# Patient Record
Sex: Female | Born: 1985 | Race: Black or African American | Hispanic: No | Marital: Single | Smoking: Former smoker
Health system: Southern US, Community
[De-identification: ages and names within clinical notes are randomized; demographics above are authoritative.]

## PROBLEM LIST (undated history)

## (undated) DIAGNOSIS — J302 Other seasonal allergic rhinitis: Secondary | ICD-10-CM

## (undated) HISTORY — PX: ECTOPIC PREGNANCY SURGERY: SHX613

---

## 2004-07-29 ENCOUNTER — Ambulatory Visit (HOSPITAL_COMMUNITY): Admission: RE | Admit: 2004-07-29 | Discharge: 2004-07-29 | Payer: Self-pay | Admitting: Obstetrics and Gynecology

## 2006-08-21 ENCOUNTER — Emergency Department (HOSPITAL_COMMUNITY): Admission: EM | Admit: 2006-08-21 | Discharge: 2006-08-21 | Payer: Self-pay | Admitting: Emergency Medicine

## 2007-09-22 ENCOUNTER — Inpatient Hospital Stay (HOSPITAL_COMMUNITY): Admission: AD | Admit: 2007-09-22 | Discharge: 2007-09-23 | Payer: Self-pay | Admitting: Gynecology

## 2010-12-18 ENCOUNTER — Encounter: Payer: Self-pay | Admitting: *Deleted

## 2010-12-18 ENCOUNTER — Emergency Department (HOSPITAL_BASED_OUTPATIENT_CLINIC_OR_DEPARTMENT_OTHER)
Admission: EM | Admit: 2010-12-18 | Discharge: 2010-12-18 | Disposition: A | Discharge status: Home / Self Care | Payer: Self-pay | Attending: Emergency Medicine | Admitting: Emergency Medicine

## 2010-12-18 ENCOUNTER — Emergency Department (INDEPENDENT_AMBULATORY_CARE_PROVIDER_SITE_OTHER): Payer: Self-pay

## 2010-12-18 DIAGNOSIS — B9689 Other specified bacterial agents as the cause of diseases classified elsewhere: Secondary | ICD-10-CM | POA: Insufficient documentation

## 2010-12-18 DIAGNOSIS — A499 Bacterial infection, unspecified: Secondary | ICD-10-CM | POA: Insufficient documentation

## 2010-12-18 DIAGNOSIS — R109 Unspecified abdominal pain: Secondary | ICD-10-CM

## 2010-12-18 DIAGNOSIS — N83209 Unspecified ovarian cyst, unspecified side: Secondary | ICD-10-CM

## 2010-12-18 DIAGNOSIS — N760 Acute vaginitis: Secondary | ICD-10-CM | POA: Insufficient documentation

## 2010-12-18 HISTORY — DX: Other seasonal allergic rhinitis: J30.2

## 2010-12-18 LAB — WET PREP, GENITAL
Trich, Wet Prep: NONE SEEN
Yeast Wet Prep HPF POC: NONE SEEN

## 2010-12-18 LAB — URINALYSIS, ROUTINE W REFLEX MICROSCOPIC
Hgb urine dipstick: NEGATIVE
Ketones, ur: 15 mg/dL — AB
Nitrite: NEGATIVE
Specific Gravity, Urine: 1.031 — ABNORMAL HIGH (ref 1.005–1.030)
pH: 6 (ref 5.0–8.0)

## 2010-12-18 MED ORDER — METRONIDAZOLE 500 MG PO TABS: 500.0000 mg | ORAL_TABLET | Freq: Two times a day (BID) | ORAL | Status: AC

## 2010-12-18 MED ORDER — HYDROCODONE-ACETAMINOPHEN 5-500 MG PO TABS: 1.0000 | ORAL_TABLET | Freq: Four times a day (QID) | ORAL | Status: AC | PRN

## 2010-12-18 NOTE — ED Notes (Signed)
Patient recently treated for chlamydia, partner was also treated. Patient now states that she is having pain in her pelvic region.

## 2010-12-18 NOTE — ED Provider Notes (Signed)
Evaluation and management procedures were performed by the mid-level provider (PA/NP/CNM) under my supervision/collaboration. I was present and available during the ED course. Zayna Toste Y.   Gavin Pound. Oletta Lamas, MD 12/18/10 2109

## 2010-12-18 NOTE — ED Provider Notes (Signed)
History     CSN: [id number] Arrival date & time: 12/18/2010  7:23 PM  Chief Complaint  Patient presents with  . Abdominal Pain    (Consider location/radiation/quality/duration/timing/severity/associated sxs/prior treatment) HPI Comments: Pt state that she was treated for chlamydia 2 weeks ago and she was asymptomatic when she found out:pt state that her partner was treated:pt states that she is having generalized lower abdominal pain:pt denies fever:pt states that is seemed worse after intercourse 2 days ago:pt denies dysuria  Patient is a 25 y.o. female presenting with abdominal pain. The history is provided by the patient.  Abdominal Pain The primary symptoms of the illness include abdominal pain. The primary symptoms of the illness do not include fever. The current episode started 2 days ago. The onset of the illness was sudden. The problem has not changed since onset. The patient states that she believes she is currently not pregnant. The patient has not had a change in bowel habit. Symptoms associated with the illness do not include constipation, urgency, frequency or back pain.    Past Medical History  Diagnosis Date  . Asthma   . Seasonal allergies     Past Surgical History  Procedure Date  . Ectopic pregnancy surgery     No family history on file.  History  Substance Use Topics  . Smoking status: Current Some Day Smoker  . Smokeless tobacco: Not on file  . Alcohol Use: No    OB History    Grav Para Term Preterm Abortions TAB SAB Ect Mult Living                  Review of Systems  Constitutional: Negative for fever.  Gastrointestinal: Positive for abdominal pain. Negative for constipation.  Genitourinary: Negative for urgency and frequency.  Musculoskeletal: Negative for back pain.  All other systems reviewed and are negative.    Allergies  Cinnamon; Doxycycline; and Penicillins  Home Medications   Current Outpatient Rx  Name Route Sig Dispense  Refill  . ALBUTEROL SULFATE HFA 108 (90 BASE) MCG/ACT IN AERS Inhalation Inhale 2 puffs into the lungs every 6 (six) hours as needed. Shortness of breath and wheezing     . BIOTIN 5000 PO Oral Take 1 tablet by mouth daily.      Marland Kitchen GARLIC 150 MG PO TABS Oral Take 1 tablet by mouth daily.      Marland Kitchen CLEAR EYES OP Both Eyes Place 2 drops into both eyes daily.      Marland Kitchen OVER THE COUNTER MEDICATION Oral Take 1 tablet by mouth daily. Hair Finity     . PROBIOTIC FORMULA PO Oral Take 1 tablet by mouth daily.      Marland Kitchen MEDROXYPROGESTERONE ACETATE 150 MG/ML IM SUSP Intramuscular Inject 150 mg into the muscle every 3 (three) months.        BP 102/75  Pulse 82  Temp 98.9 F (37.2 C)  Resp 18  SpO2 100%  Physical Exam  Nursing note and vitals reviewed. Constitutional: She is oriented to person, place, and time. She appears well-developed and well-nourished.  HENT:  Head: Normocephalic and atraumatic.  Eyes: Pupils are equal, round, and reactive to light.  Cardiovascular: Normal rate and regular rhythm.   Pulmonary/Chest: Effort normal and breath sounds normal.  Abdominal: Soft. There is tenderness.  Genitourinary:       Pt has white vaginal discharge:pt has no cmt  Musculoskeletal: Normal range of motion.  Neurological: She is alert and oriented to person, place, and time.  Skin: Skin is warm and dry.  Psychiatric: She has a normal mood and affect.    ED Course  Procedures (including critical care time)  Labs Reviewed  URINALYSIS, ROUTINE W REFLEX MICROSCOPIC - Abnormal; Notable for the following:    Color, Urine AMBER (*) BIOCHEMICALS MAY BE AFFECTED BY COLOR   Specific Gravity, Urine 1.031 (*)    Bilirubin Urine SMALL (*)    Ketones, ur 15 (*)    All other components within normal limits  WET PREP, GENITAL - Abnormal; Notable for the following:    Clue Cells, Wet Prep MANY (*)    WBC, Wet Prep HPF POC RARE (*)    All other components within normal limits  PREGNANCY, URINE  GC/CHLAMYDIA  PROBE AMP, GENITAL   US Transvaginal Non-ob  12/18/2010  *RADIOLOGY REPORT*  Clinical Data: 25 year old with pelvic pain.  Evaluate for tubo- ovarian abscess.  TRANSABDOMINAL AND TRANSVAGINAL ULTRASOUND OF PELVIS Technique:  Both transabdominal and transvaginal ultrasound examinations of the pelvis were performed. Transabdominal technique was performed for global imaging of the pelvis including uterus, ovaries, adnexal regions, and pelvic cul-de-sac.  Comparison: None.   It was necessary to proceed with endovaginal exam following the transabdominal exam to visualize the adnexa and uterus.  Findings:  Uterus: Normal in size and appearance.  The uterus measures 7.9 x 4.0 x 4.2 cm.  Endometrium: Measures 0.2 cm.  Right ovary:  The right adnexa measures 4.3 x 3.2 x 3.0 cm.  There is a hypoechoic cystic structure associated the right adnexa.  The structure measures 2.5 x 2.2 x 2.3 cm.  The cyst appears to be relatively simple.  There is no significant internal vascularity or internal septations.  Left ovary: Normal appearance of left ovary with small follicles. The left ovary measures 2.6 x 1.4 x 2.3 cm.  Other findings: No free fluid.  IMPRESSION: Right adnexal cyst.  The cyst measures up to 2.5 cm.  Original Report Authenticated By: Richarda Overlie, M.D.   US Pelvis Complete  12/18/2010  *RADIOLOGY REPORT*  Clinical Data: 25 year old with pelvic pain.  Evaluate for tubo- ovarian abscess.  TRANSABDOMINAL AND TRANSVAGINAL ULTRASOUND OF PELVIS Technique:  Both transabdominal and transvaginal ultrasound examinations of the pelvis were performed. Transabdominal technique was performed for global imaging of the pelvis including uterus, ovaries, adnexal regions, and pelvic cul-de-sac.  Comparison: None.   It was necessary to proceed with endovaginal exam following the transabdominal exam to visualize the adnexa and uterus.  Findings:  Uterus: Normal in size and appearance.  The uterus measures 7.9 x 4.0 x 4.2 cm.   Endometrium: Measures 0.2 cm.  Right ovary:  The right adnexa measures 4.3 x 3.2 x 3.0 cm.  There is a hypoechoic cystic structure associated the right adnexa.  The structure measures 2.5 x 2.2 x 2.3 cm.  The cyst appears to be relatively simple.  There is no significant internal vascularity or internal septations.  Left ovary: Normal appearance of left ovary with small follicles. The left ovary measures 2.6 x 1.4 x 2.3 cm.  Other findings: No free fluid.  IMPRESSION: Right adnexal cyst.  The cyst measures up to 2.5 cm.  Original Report Authenticated By: Richarda Overlie, M.D.     No diagnosis found.    MDM  Pt has cyst and JY:NWGN treat and pt can follow up with her gyn        Teressa Lower, NP 12/18/10 2100

## 2010-12-19 LAB — URINALYSIS, ROUTINE W REFLEX MICROSCOPIC
Bilirubin Urine: NEGATIVE
Hgb urine dipstick: NEGATIVE
Ketones, ur: NEGATIVE
Nitrite: NEGATIVE
Protein, ur: NEGATIVE
Specific Gravity, Urine: 1.025
Urobilinogen, UA: 1

## 2010-12-19 LAB — WET PREP, GENITAL: Trich, Wet Prep: NONE SEEN

## 2010-12-19 LAB — GC/CHLAMYDIA PROBE AMP, GENITAL
Chlamydia, DNA Probe: NEGATIVE
GC Probe Amp, Genital: NEGATIVE

## 2011-04-23 ENCOUNTER — Emergency Department (HOSPITAL_BASED_OUTPATIENT_CLINIC_OR_DEPARTMENT_OTHER)
Admission: EM | Admit: 2011-04-23 | Discharge: 2011-04-23 | Disposition: A | Discharge status: Home / Self Care | Payer: Medicaid Other | Attending: Emergency Medicine | Admitting: Emergency Medicine

## 2011-04-23 ENCOUNTER — Encounter (HOSPITAL_BASED_OUTPATIENT_CLINIC_OR_DEPARTMENT_OTHER): Payer: Self-pay | Admitting: *Deleted

## 2011-04-23 DIAGNOSIS — M26629 Arthralgia of temporomandibular joint, unspecified side: Secondary | ICD-10-CM

## 2011-04-23 DIAGNOSIS — J45909 Unspecified asthma, uncomplicated: Secondary | ICD-10-CM | POA: Insufficient documentation

## 2011-04-23 DIAGNOSIS — H9209 Otalgia, unspecified ear: Secondary | ICD-10-CM | POA: Insufficient documentation

## 2011-04-23 DIAGNOSIS — F17200 Nicotine dependence, unspecified, uncomplicated: Secondary | ICD-10-CM | POA: Insufficient documentation

## 2011-04-23 MED ORDER — NAPROXEN 500 MG PO TABS: 500.0000 mg | ORAL_TABLET | Freq: Two times a day (BID) | ORAL | Status: AC

## 2011-04-23 MED ORDER — CYCLOBENZAPRINE HCL 10 MG PO TABS: 10.0000 mg | ORAL_TABLET | Freq: Two times a day (BID) | ORAL | Status: AC | PRN

## 2011-04-23 NOTE — ED Provider Notes (Signed)
History     CSN: [id number]  Arrival date & time 04/23/11  6440   First MD Initiated Contact with Patient 04/23/11 1820      Chief Complaint  Patient presents with  . Otalgia    (Consider location/radiation/quality/duration/timing/severity/associated sxs/prior treatment) Patient is a 26 y.o. female presenting with ear pain. The history is provided by the patient. No language interpreter was used.  Otalgia This is a new problem. The current episode started more than 2 days ago. There is pain in the right ear. The problem occurs daily. The problem has been gradually worsening. There has been no fever. The pain is moderate.  Patient reports pain at right TMJ, onset several days ago--worse with chewing, opening mouth.  Past Medical History  Diagnosis Date  . Asthma   . Seasonal allergies     Past Surgical History  Procedure Date  . Ectopic pregnancy surgery     History reviewed. No pertinent family history.  History  Substance Use Topics  . Smoking status: Current Some Day Smoker  . Smokeless tobacco: Not on file  . Alcohol Use: No    OB History    Grav Para Term Preterm Abortions TAB SAB Ect Mult Living                  Review of Systems  HENT: Positive for ear pain.     Allergies  Cinnamon; Nutmeg oil (myristica oil); Doxycycline; and Penicillins  Home Medications   Current Outpatient Rx  Name Route Sig Dispense Refill  . ALBUTEROL SULFATE HFA 108 (90 BASE) MCG/ACT IN AERS Inhalation Inhale 2 puffs into the lungs every 6 (six) hours as needed. Shortness of breath and wheezing     . BIOTIN 5000 PO Oral Take 1 tablet by mouth daily.      Marland Kitchen GARLIC 150 MG PO TABS Oral Take 1 tablet by mouth daily.      Marland Kitchen MEDROXYPROGESTERONE ACETATE 150 MG/ML IM SUSP Intramuscular Inject 150 mg into the muscle every 3 (three) months.      Marland Kitchen CLEAR EYES OP Both Eyes Place 2 drops into both eyes daily.      Marland Kitchen OVER THE COUNTER MEDICATION Oral Take 1 tablet by mouth daily. Hair  Finity       Pulse 75  Temp 99.5 F (37.5 C)  Resp 16  Ht 5\' 4"  (1.626 m)  Wt 130 lb (58.968 kg)  BMI 22.31 kg/m2  SpO2 100%  Physical Exam  Constitutional: She appears well-developed and well-nourished.  HENT:  Head: Normocephalic.  Eyes: Pupils are equal, round, and reactive to light.  Neck: Normal range of motion. Neck supple.  Cardiovascular: Normal rate and regular rhythm.   Pulmonary/Chest: Effort normal and breath sounds normal.  Abdominal: Soft. Bowel sounds are normal.  Musculoskeletal: Normal range of motion.  Neurological: She is alert.  Skin: Skin is warm and dry.  Psychiatric: She has a normal mood and affect.    ED Course  Procedures (including critical care time)  Labs Reviewed - No data to display No results found.   No diagnosis found.   1.  TMJ disorder MDM          Jimmye Norman, NP 04/23/11 2027

## 2011-04-23 NOTE — ED Provider Notes (Signed)
Medical screening examination/treatment/procedure(s) were performed by non-physician practitioner and as supervising physician I was immediately available for consultation/collaboration.  Doug Sou, MD 04/23/11 2113

## 2011-04-23 NOTE — ED Notes (Signed)
Pt c/o right ear pain x 1 week 

## 2011-05-13 ENCOUNTER — Emergency Department (HOSPITAL_BASED_OUTPATIENT_CLINIC_OR_DEPARTMENT_OTHER)
Admission: EM | Admit: 2011-05-13 | Discharge: 2011-05-13 | Disposition: A | Discharge status: Home Health | Payer: Medicaid Other | Attending: Emergency Medicine | Admitting: Emergency Medicine

## 2011-05-13 ENCOUNTER — Encounter (HOSPITAL_BASED_OUTPATIENT_CLINIC_OR_DEPARTMENT_OTHER): Payer: Self-pay | Admitting: *Deleted

## 2011-05-13 DIAGNOSIS — J069 Acute upper respiratory infection, unspecified: Secondary | ICD-10-CM | POA: Insufficient documentation

## 2011-05-13 DIAGNOSIS — F17200 Nicotine dependence, unspecified, uncomplicated: Secondary | ICD-10-CM | POA: Insufficient documentation

## 2011-05-13 DIAGNOSIS — J45909 Unspecified asthma, uncomplicated: Secondary | ICD-10-CM | POA: Insufficient documentation

## 2011-05-13 NOTE — Discharge Instructions (Signed)
Antibiotic Nonuse  Your caregiver felt that the infection or problem was not one that would be helped with an antibiotic. Infections may be caused by viruses or bacteria. Only a caregiver can tell which one of these is the likely cause of an illness. A cold is the most common cause of infection in both adults and children. A cold is a virus. Antibiotic treatment will have no effect on a viral infection. Viruses can lead to many lost days of work caring for sick children and many missed days of school. Children may catch as many as 10 "colds" or "flus" per year during which they can be tearful, cranky, and uncomfortable. The goal of treating a virus is aimed at keeping the ill person comfortable. Antibiotics are medications used to help the body fight bacterial infections. There are relatively few types of bacteria that cause infections but there are hundreds of viruses. While both viruses and bacteria cause infection they are very different types of germs. A viral infection will typically go away by itself within 7 to 10 days. Bacterial infections may spread or get worse without antibiotic treatment. Examples of bacterial infections are:  Sore throats (like strep throat or tonsillitis).     Infection in the lung (pneumonia).     Ear and skin infections.  Examples of viral infections are:  Colds or flus.     Most coughs and bronchitis.     Sore throats not caused by Strep.     Runny noses.  It is often best not to take an antibiotic when a viral infection is the cause of the problem. Antibiotics can kill off the helpful bacteria that we have inside our body and allow harmful bacteria to start growing. Antibiotics can cause side effects such as allergies, nausea, and diarrhea without helping to improve the symptoms of the viral infection. Additionally, repeated uses of antibiotics can cause bacteria inside of our body to become resistant. That resistance can be passed onto harmful bacterial. The  next time you have an infection it may be harder to treat if antibiotics are used when they are not needed. Not treating with antibiotics allows our own immune system to develop and take care of infections more efficiently. Also, antibiotics will work better for us when they are prescribed for bacterial infections. Treatments for a child that is ill may include:  Give extra fluids throughout the day to stay hydrated.     Get plenty of rest.     Only give your child over-the-counter or prescription medicines for pain, discomfort, or fever as directed by your caregiver.     The use of a cool mist humidifier may help stuffy noses.     Cold medications if suggested by your caregiver.  Your caregiver may decide to start you on an antibiotic if:  The problem you were seen for today continues for a longer length of time than expected.     You develop a secondary bacterial infection.  SEEK MEDICAL CARE IF:  Fever lasts longer than 5 days.     Symptoms continue to get worse after 5 to 7 days or become severe.     Difficulty in breathing develops.     Signs of dehydration develop (poor drinking, rare urinating, dark colored urine).     Changes in behavior or worsening tiredness (listlessness or lethargy).  Document Released: 05/19/2001 Document Revised: 11/20/2010 Document Reviewed: 11/15/2008 Exit[employer] Patient Information 2012 [employer]. 

## 2011-05-13 NOTE — ED Notes (Signed)
Facial pain, sore throat, ear pain, runny nose and cough since yesterday.

## 2011-05-13 NOTE — ED Provider Notes (Addendum)
History     CSN: [id number]  Arrival date & time 05/13/11  1232   First MD Initiated Contact with Patient 05/13/11 1250      Chief Complaint  Patient presents with  . URI    (Consider location/radiation/quality/duration/timing/severity/associated sxs/prior treatment) Patient is a 26 y.o. female presenting with URI. The history is provided by the patient.  URI   the patient reports nasal congestion and sinus discomfort has been constant since last night.  She woke up with a runny nose that has been consistently running all day.  She also reports sore throat.  She denies fevers and chills.  She denies myalgias.  She denies cough or shortness of breath.  She denies chest pain.  She denies nausea vomiting and diarrhea.  She reports she has severe allergies and over-the-counter medications not been helping.  Nothing worsens her symptoms.  Nothing improves her symptoms.  Her symptoms are constant.  She denies headache  Past Medical History  Diagnosis Date  . Asthma   . Seasonal allergies     Past Surgical History  Procedure Date  . Ectopic pregnancy surgery     No family history on file.  History  Substance Use Topics  . Smoking status: Current Some Day Smoker  . Smokeless tobacco: Not on file  . Alcohol Use: No    OB History    Grav Para Term Preterm Abortions TAB SAB Ect Mult Living                  Review of Systems  All other systems reviewed and are negative.    Allergies  Cinnamon; Nutmeg oil (myristica oil); Doxycycline; and Penicillins  Home Medications   Current Outpatient Rx  Name Route Sig Dispense Refill  . ALBUTEROL SULFATE HFA 108 (90 BASE) MCG/ACT IN AERS Inhalation Inhale 2 puffs into the lungs every 6 (six) hours as needed. Shortness of breath and wheezing     . BIOTIN 5000 PO Oral Take 1 tablet by mouth daily.      Marland Kitchen GARLIC 150 MG PO TABS Oral Take 1 tablet by mouth daily.      Marland Kitchen MEDROXYPROGESTERONE ACETATE 150 MG/ML IM SUSP Intramuscular  Inject 150 mg into the muscle every 3 (three) months.      Marland Kitchen CLEAR EYES OP Both Eyes Place 2 drops into both eyes daily.      Marland Kitchen NAPROXEN 500 MG PO TABS Oral Take 1 tablet (500 mg total) by mouth 2 (two) times daily. 30 tablet 0  . OVER THE COUNTER MEDICATION Oral Take 1 tablet by mouth daily. Hair Finity       BP 105/72  Pulse 85  Temp(Src) 98.4 F (36.9 C) (Oral)  Resp 16  SpO2 100%  Physical Exam  Constitutional: She is oriented to person, place, and time. She appears well-developed and well-nourished.  HENT:  Head: Normocephalic.  Right Ear: Tympanic membrane, external ear and ear canal normal. Tympanic membrane is not injected.  Left Ear: Tympanic membrane, external ear and ear canal normal. Tympanic membrane is not injected.  Eyes: EOM are normal.  Neck: Normal range of motion.  Cardiovascular: Regular rhythm.   Pulmonary/Chest: Effort normal and breath sounds normal. She has no wheezes.  Musculoskeletal: Normal range of motion.  Neurological: She is alert and oriented to person, place, and time.  Psychiatric: She has a normal mood and affect.    ED Course  Procedures (including critical care time)  Labs Reviewed - No data to display  No results found.   1. Upper respiratory tract infection       MDM  Likely viral upper respiratory tract infections.  The patient is well-appearing.  She is nontoxic.  No hypoxia on exam.  Lung exam is clear.  Normal work of breathing.  No indication for chest x-ray.  Close followup with PCP         Lyanne Co, MD 05/13/11 1318  Lyanne Co, MD 05/13/11 1318

## 2013-05-11 NOTE — ED Provider Notes (Signed)
Formatting of this note is different from the original.  [facility]    ED Provider Note  Medical screening initiated in triage by Dolan AmenSarah M Bailey, NP.  05/11/2013 / 11:31 AM    Vanessa Giles 28 y.o. female DOB: 06/14/1985 MRN: [id number]  History     Chief Complaint   Patient presents with   ? Nausea     exposed to fumes at work from a vent today, now c/o nausea, HA, cough     Patient is a 28 y.o. female presenting with panic attack.   History provided by:  Patient  Language interpreter used: No    Panic Attack  Presenting symptoms comment:  Anxiety   Degree of incapacity (severity):  Moderate  Onset quality:  Sudden  Duration:  1 day  Timing:  Constant  Progression:  Worsening  Chronicity:  New  Context comment:  Patient was in office and was exposed to "kerosene fumes" from a vent that was close by. She has a history of asthma and panic attacks. She was exposed to vent for 10 minutes and went to her car and had a panic attack in the car.    Treatment compliance:  Untreated  Relieved by:  None tried  Ineffective treatments:  None tried  Associated symptoms: anxiety and hyperventilating    Associated symptoms: no abdominal pain      Past Medical History   Diagnosis Date   ? Environmental allergies    ? Asthma      History reviewed. No pertinent past surgical history.    History   Alcohol Use   ? 2.4 oz/week   ? 4 Glasses of wine per week     History   Smoking status   ? Former Smoker   ? Quit date: 03/10/2013   Smokeless tobacco   ? Not on file     History   Drug Use No     Allergies   Allergen Reactions   ? Cinnamon Anaphylaxis   ? Nutmeg Oil (Myristica Oil) Anaphylaxis   ? Doxycycline Nausea And Vomiting   ? Penicillins Hives     Discharge Medication List as of 05/11/2013  3:13 PM     CONTINUE these medications which have NOT CHANGED    Details   cetirizine (ZYRTEC) 10 MG tablet Take 10 mg by mouth daily., Until Discontinued, Historical Med         Review of Systems     Review of  Systems   Gastrointestinal: Negative for abdominal pain.   Psychiatric/Behavioral: The patient is nervous/anxious.      Physical Exam     ED Triage Vitals   BP 05/11/13 1125 110/70 mmHg   Heart Rate 05/11/13 1125 105   Resp 05/11/13 1125 18   SpO2 05/11/13 1125 97 %   Temp 05/11/13 1125 98.7 F (37.1 C)     Physical Exam   Vitals reviewed.  Constitutional: She is oriented to person, place, and time. She appears well-developed and well-nourished. No distress.   HENT:   Head: Atraumatic.   Eyes: EOM are normal. Pupils are equal, round, and reactive to light. No scleral icterus.   Neck: Normal range of motion. Neck supple.   Cardiovascular: Normal rate, regular rhythm and normal heart sounds.    Pulmonary/Chest: Effort normal and breath sounds normal. No respiratory distress. She has no wheezes. She has no rales.   Abdominal: Soft. Bowel sounds are normal. There is no tenderness. There is  no rebound and no guarding.   Musculoskeletal: Normal range of motion. She exhibits no edema and no tenderness.   Lymphadenopathy:     She has no cervical adenopathy.   Neurological: She is alert and oriented to person, place, and time. No cranial nerve deficit.   Skin: Skin is warm and dry. No rash noted.   Psychiatric: Her mood appears anxious. Her speech is rapid and/or pressured.     ED Course     Lab results:    CARBOXYHEMOGLOBIN RESP CARE     Imaging:  No data to display  ECG:  EKG Results    None       Procedures    MDM  Number of Diagnoses or Management Options  Panic attack:   Diagnosis management comments: Patient here for panic attack and breathing next to vent she is concerned with Kerosene    Carboxyhemoglobin - 1.3. Difficulty uploading that result into EPIC.     Patient feeling better.       Amount and/or Complexity of Data Reviewed  Clinical lab tests: reviewed and ordered    Coding    Discharge Medication List as of 05/11/2013  3:13 PM       Clinical Impression     Final diagnoses:   Panic attack     ED Disposition     Disposition Comments    Discharge Elisama Catrece Zane discharge to home/self care.  Condition at discharge: Stable        Fidela Salisbury, PA  05/11/13 1611  Electronically signed by Theresa Mulligan, MD at 05/12/2013  6:55 AM EST

## 2013-05-11 NOTE — ED Notes (Signed)
Formatting of this note might be different from the original.  O2 initiated at 2L/M per nasal canula per M. Horton PA-C.  Electronically signed by Eston MouldJohnetta P Roberts, RN at 05/11/2013 12:12 PM EST

## 2013-05-11 NOTE — ED Notes (Signed)
Formatting of this note might be different from the original.  Pt up ambulatory to the bathroom gait good and steady awaiting for results, no problems noted  Electronically signed by Anne FuWendy Sue Cowher, LPN at 14/78/295602/18/2015  3:07 PM EST

## 2013-05-11 NOTE — ED Notes (Signed)
Formatting of this note might be different from the original.  Pt states she was at work and sat down and could only smell kerosene because the vent is over her head, but they had to evacuate the building and she went to her car very light headed and coughing took albuterol and singular pill when in the car and the EMS was called for her because of the rxn while smelling the kerosene so they brought her here for either asthma attack? Or anxiety attack but does report se should have had her allergy shots today  Electronically signed by Anne FuWendy Sue Cowher, LPN at 16/10/960402/18/2015 12:03 PM EST

## 2013-05-11 NOTE — ED Provider Notes (Signed)
Formatting of this note might be different from the original.  I supervised the care of this patient    Lanetta InchBenjamin P Sayers MD    Theresa MulliganBenjamin Sayers, MD  05/12/13 [phone]  Electronically signed by Theresa MulliganBenjamin Sayers, MD at 05/12/2013  6:55 AM EST

## 2015-10-01 ENCOUNTER — Other Ambulatory Visit: Payer: Self-pay | Admitting: Allergy

## 2015-10-01 MED ORDER — DESLORATADINE 5 MG PO TABS: 5.0000 mg | ORAL_TABLET | Freq: Every day | ORAL | Status: DC

## 2015-10-19 DIAGNOSIS — M25561 Pain in right knee: Secondary | ICD-10-CM | POA: Insufficient documentation

## 2015-10-19 DIAGNOSIS — R42 Dizziness and giddiness: Secondary | ICD-10-CM | POA: Insufficient documentation

## 2015-10-19 DIAGNOSIS — J351 Hypertrophy of tonsils: Secondary | ICD-10-CM | POA: Insufficient documentation

## 2015-10-19 DIAGNOSIS — F17210 Nicotine dependence, cigarettes, uncomplicated: Secondary | ICD-10-CM | POA: Insufficient documentation

## 2015-10-19 DIAGNOSIS — G43009 Migraine without aura, not intractable, without status migrainosus: Secondary | ICD-10-CM | POA: Insufficient documentation

## 2015-10-19 DIAGNOSIS — B3731 Acute candidiasis of vulva and vagina: Secondary | ICD-10-CM | POA: Insufficient documentation

## 2015-10-19 DIAGNOSIS — Z1321 Encounter for screening for nutritional disorder: Secondary | ICD-10-CM | POA: Insufficient documentation

## 2015-12-25 ENCOUNTER — Other Ambulatory Visit: Payer: Self-pay | Admitting: Allergy

## 2015-12-25 MED ORDER — DESLORATADINE 5 MG PO TABS: 5.0000 mg | ORAL_TABLET | Freq: Every day | ORAL | 0 refills | Status: DC

## 2016-01-27 ENCOUNTER — Other Ambulatory Visit: Payer: Self-pay | Admitting: Pediatrics

## 2016-02-19 ENCOUNTER — Other Ambulatory Visit: Payer: Self-pay | Admitting: Allergy

## 2016-02-22 ENCOUNTER — Other Ambulatory Visit: Payer: Self-pay | Admitting: *Deleted

## 2016-02-22 NOTE — Telephone Encounter (Signed)
Refused Desloratadine 5mg . Patient needs OV.

## 2016-02-25 ENCOUNTER — Other Ambulatory Visit: Payer: Self-pay

## 2016-10-20 NOTE — Telephone Encounter (Signed)
Formatting of this note might be different from the original.  JBK pt. Patient called saying she sent a message and picture [facility] that she would like looked at and called back about.    Her number is [phone]   Electronically signed by Nat MathSarasin, Joni L at 10/20/2016  4:45 PM EDT

## 2016-10-20 NOTE — Telephone Encounter (Signed)
Formatting of this note might be different from the original.  Told pt dr. Philis Piquekehaya said the discharge she's having doesn't look like anything to worry about.  Told pt sometimes it takes a long time to start having periods again after stopping depo.  Told pt dr. Philis Piquekehaya said she can go ahead and start on the ocp's.  Electronically signed by Alinda MoneyMiller, Patricia C, LPN at 54/09/811907/30/2018  5:02 PM EDT

## 2018-01-14 ENCOUNTER — Other Ambulatory Visit: Payer: Self-pay | Admitting: Obstetrics and Gynecology

## 2018-01-14 DIAGNOSIS — N6310 Unspecified lump in the right breast, unspecified quadrant: Secondary | ICD-10-CM

## 2018-01-22 ENCOUNTER — Ambulatory Visit
Admission: RE | Admit: 2018-01-22 | Discharge: 2018-01-22 | Disposition: A | Discharge status: Home / Self Care | Payer: BLUE CROSS/BLUE SHIELD | Source: Ambulatory Visit | Attending: Obstetrics and Gynecology | Admitting: Obstetrics and Gynecology

## 2018-01-22 DIAGNOSIS — N6310 Unspecified lump in the right breast, unspecified quadrant: Secondary | ICD-10-CM

## 2019-02-25 DIAGNOSIS — D649 Anemia, unspecified: Secondary | ICD-10-CM | POA: Insufficient documentation

## 2019-02-25 DIAGNOSIS — N390 Urinary tract infection, site not specified: Secondary | ICD-10-CM | POA: Insufficient documentation

## 2019-02-25 DIAGNOSIS — R87619 Unspecified abnormal cytological findings in specimens from cervix uteri: Secondary | ICD-10-CM | POA: Insufficient documentation

## 2019-02-25 DIAGNOSIS — J45909 Unspecified asthma, uncomplicated: Secondary | ICD-10-CM | POA: Insufficient documentation

## 2019-03-03 NOTE — Progress Notes (Signed)
New Patient Note  RE: Brittany Watts MRN: [id number] DOB: August 25, 1985 Date of Office Visit: 03/04/2019  Referring provider: Elijio Miles., MD Primary care provider: Bosie Clos, MD  Chief Complaint: Allergic Rhinitis  and Asthma  History of Present Illness: I had the pleasure of seeing Brittany Watts for initial evaluation at the [facility] on 03/04/2019. She is a 33 y.o. female, who is referred here by Bosie Clos, MD for the evaluation of allergies and asthma.  Patient was seen in our office in the past for allergic rhinitis and asthma. Last OV was in June 2016.   Asthma: She reports symptoms of chest tightness, shortness of breath, coughing, wheezing, nocturnal awakenings for 10+ years. Current medications include albuterol prn which help. She reports not using aerochamber with inhalers. She tried the following inhalers: Advair Diskus, nebulizer - duoneb. Main triggers are rain, allergies, pet exposure. In the last month, frequency of symptoms: 2-3x/week. Frequency of nocturnal symptoms: every other night. Frequency of SABA use: 2-3x/week. Interference with physical activity: sometimes. Sleep is disturbed. In the last 12 months, emergency room visits/urgent care visits/doctor office visits or hospitalizations due to respiratory issues: 4. In the last 12 months, oral steroids courses: 3. Lifetime history of hospitalization for respiratory issues: no. Prior intubations: no. Asthma was diagnosed at age 82s. History of pneumonia: yes in her 7s. She was evaluated by allergist in the past. Smoking exposure: quit. Up to date with flu vaccine: no.  History of reflux: denies.  Allergies: She reports symptoms of nasal congestion, sneezing, rhinorrhea, itchy eyes.  Symptoms have been going on for 15+ years. The symptoms are present all year around with worsening when it rains. Other triggers include exposure to pet dander. Anosmia: no. Headache: sometimes. She has  used Xyzal, Singulair, Dymista, Flonase, Nasacort with minimal improvement in symptoms. Sinus infections: 2. Previous work up includes: skin testing in 2011 which was positive to grass, weed, trees, dust mite, cat, dog. 2013 testing was positive to cat, grass, dust mite, trees, ragweed, weed, cockroach, mold, horse, feather. Patient was on allergy injection for a few years but stopped due to localized reactions. She did not notice much improvement after the injections.  Previous ENT evaluation: yes but no surgical procedure.  Previous sinus imaging: no. History of nasal polyps: no. Last eye exam: yes once a year.   Assessment and Plan: Brittany Watts is a 33 y.o. female with: Not well controlled moderate persistent asthma Patient was diagnosed with asthma over 10 years ago and currently using albuterol a few times a week with some benefit.  Triggers are rain, allergies and pet exposure.  The past year she had at least 3 courses of prednisone due to asthma exacerbations.  Today's spirometry was normal with no improvement in FEV1 post bronchodilator treatment. . Daily controller medication(s): start Breo 100 1 puff daily and rinse mouth afterwards. Sample given, demonstrated proper use.  . Prior to physical activity: May use albuterol rescue inhaler 2 puffs 5 to 15 minutes prior to strenuous physical activities. Marland Kitchen Rescue medications: May use albuterol rescue inhaler 2 puffs or nebulizer every 4 to 6 hours as needed for shortness of breath, chest tightness, coughing, and wheezing. Monitor frequency of use.  . Repeat spirometry at next visit.  Other allergic rhinitis Perennial rhinoconjunctivitis symptoms for at least 15 years with worsening with weather change.  Tried Xyzal, Singulair, Dymista, Flonase and Nasacort with some benefit.  Skin testing in 2011 was positive to  grass, weed, trees, dust mites, cat, dog. 2013 testing was positive to cat, grass, dust mite, trees, ragweed, weed, cockroach, mold,  horse, feather. Patient was on allergy injection for a few years but stopped due to localized reactions.  Unable to skin prick test today due to extensive tattoos on the back and upper arm.  We will get blood work instead and make additional recommendations based on results.  Start Xhance 2 sprays per nostril twice a day for nasal symptoms.  Sample given, demonstrated proper use.   Nasal saline spray (i.e., Simply Saline) or nasal saline lavage (i.e., NeilMed) is recommended as needed and prior to medicated nasal sprays.  May use over the counter antihistamines such as Zyrtec (cetirizine), Claritin (loratadine), Allegra (fexofenadine), or Xyzal (levocetirizine) daily as needed and may take twice a day for breakthrough symptoms.   Continue Singulair  daily at night.  May use Patanol 0.1% 1 drop in each eye twice daily as needed for itchy/watery eyes.   Do not put over contacts. Wait about 15 minutes after eye drops to put contacts back in.  Start environmental control measures.   Allergic conjunctivitis of both eyes  See assessment and plan as above allergic rhinitis.  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  Patient used to have EpiPen but no longer has one.  Avoid cinnamon and nutmeg. Will check via bloodwork.   I have prescribed epinephrine injectable and demonstrated proper use. For mild symptoms you can take over the counter antihistamines such as Benadryl and monitor symptoms closely. If symptoms worsen or if you have severe symptoms including breathing issues, throat closure, significant swelling, whole body hives, severe diarrhea and vomiting, lightheadedness then inject epinephrine and seek immediate medical care afterwards.  Food action plan given.  Bee sting allergy Reactions as a child with shortness of breath and lip swelling requiring ER visit.  No previous evaluation.  Avoid  insect/bee stings.  I have prescribed epinephrine injectable and demonstrated proper use. For mild symptoms you can take over the counter antihistamines such as Benadryl and monitor symptoms closely. If symptoms worsen or if you have severe symptoms including breathing issues, throat closure, significant swelling, whole body hives, severe diarrhea and vomiting, lightheadedness then inject epinephrine and seek immediate medical care afterwards.  History of penicillin allergy Broke out in hives at the age of 42 and no penicillin type antibiotics since then.  Continue avoidance and consider penicillin testing in future.  Return in about 4 weeks (around 04/01/2019).  Meds ordered this encounter  Medications  . fluticasone furoate-vilanterol (BREO ELLIPTA) 100-25 MCG/INH AEPB    Sig: Inhale 1 puff into the lungs daily.    Dispense:  28 each    Refill:  5  . olopatadine (PATANOL) 0.1 % ophthalmic solution    Sig: Place 1 drop into both eyes 2 (two) times daily as needed for allergies (itchy/watery eyes).    Dispense:  5 mL    Refill:  5  . EPINEPHrine 0.3 mg/0.3 mL IJ SOAJ injection    Sig: Inject 0.3 mLs (0.3 mg total) into the muscle as needed for anaphylaxis.    Dispense:  1 each    Refill:  3  . levocetirizine (XYZAL) 5 MG tablet    Sig: Take 1 tablet (5 mg total) by mouth daily.    Dispense:  30 tablet    Refill:  5  . Fluticasone Propionate (XHANCE) 93 MCG/ACT EXHU  Sig: Place 2 sprays into the nose 2 (two) times daily.    Dispense:  32 mL    Refill:  5    Lab Orders     Allergens w/Total IgE Area 2     Tryptase     Allergen Hymenoptera Panel     Allergen, Cinnamon [id number]     Allergen, Nutmeg, [id number]  Other allergy screening: Food allergy: yes  Cinnamon causes shortness of breath, itchy, scratchy. Patient used to have an Epipen but does not have it anymore. Patient used the Epipen once after she had baked beans with cinnamon.   Nutmeg causes similar symptoms as above.    Medication allergy: yes  Doxycycline - vomiting, Penicillin - hives at age 33.  Hymenoptera allergy: had shortness of breath, lip swelling as a child, had to go to ER twice due to this.  Urticaria: no Eczema:yes History of recurrent infections suggestive of immunodeficency: no  Diagnostics: Spirometry:  Tracings reviewed. Her effort: Good reproducible efforts. FVC: 2.89L FEV1: 2.21L, 82% predicted FEV1/FVC ratio: 76% Interpretation: Spirometry consistent with normal pattern with no improvement in FEV1 post bronchodilator treatment.  Please see scanned spirometry results for details.  Skin Testing: None. Patient has extensive tattoos and will get bloodwork.    Past Medical History: Patient Active Problem List   Diagnosis Date Noted  . Other allergic rhinitis 03/04/2019  . Allergic conjunctivitis of both eyes 03/04/2019  . Allergy with anaphylaxis due to food, subsequent encounter 03/04/2019  . Bee sting allergy 03/04/2019  . Not well controlled moderate persistent asthma 03/04/2019  . History of penicillin allergy 03/04/2019  . Abnormal cervical Papanicolaou smear 02/25/2019  . Acute urinary tract infection 02/25/2019  . Anemia 02/25/2019  . Asthma 02/25/2019  . Chronic pain of right knee 10/19/2015  . Cigarette smoker 10/19/2015  . Encounter for vitamin deficiency screening 10/19/2015  . Enlarged tonsils 10/19/2015  . Nonintractable migraine 10/19/2015  . Vaginal yeast infection 10/19/2015  . Vertigo 10/19/2015   Past Medical History:  Diagnosis Date  . Asthma   . Seasonal allergies    Past Surgical History: Past Surgical History:  Procedure Laterality Date  . ECTOPIC PREGNANCY SURGERY     Medication List:  Current Outpatient Medications  Medication Sig Dispense Refill  . albuterol (PROVENTIL HFA;VENTOLIN HFA) 108 (90 BASE) MCG/ACT inhaler Inhale 2 puffs into the lungs every 6 (six) hours as needed. Shortness of breath and wheezing     . albuterol (PROVENTIL)  (2.5 MG/3ML) 0.083% nebulizer solution albuterol sulfate 2.5 mg/3 mL (0.083 %) solution for nebulization    . ALPRAZolam (XANAX) 0.5 MG tablet Take 0.5 mg by mouth 3 (three) times daily.    . APPLE CIDER VINEGAR PO Take by mouth.    . benzonatate (TESSALON) 200 MG capsule Take 400 mg by mouth every 8 (eight) hours as needed.    Marland Kitchen. BIOTIN 5000 PO Take 1 tablet by mouth daily.      . Butalbital-APAP-Caffeine 50-325-40 MG capsule butalbital-acetaminophen-caffeine 50 mg-325 mg-40 mg capsule  TAKE ONE CAPSULE BY MOUTH EVERY 6 HOURS AS NEEDED FOR HEADACHE    . COLLAGEN PO Take by mouth.    . escitalopram (LEXAPRO) 10 MG tablet Take 10 mg by mouth daily.    . Garlic 150 MG TABS Take 1 tablet by mouth daily.      Marland Kitchen. levocetirizine (XYZAL) 5 MG tablet Take 1 tablet (5 mg total) by mouth daily. 30 tablet 5  . Linoleic Acid-Sunflower Oil (CLA PO) Take by  mouth.    . meclizine (ANTIVERT) 12.5 MG tablet TAKE ONE TABLET BY MOUTH THREE TIMES A DAY FOR 20 DAYS    . medroxyPROGESTERone (DEPO-PROVERA) 150 MG/ML injection Inject 150 mg into the muscle every 3 (three) months.      . montelukast (SINGULAIR) 10 MG tablet Take 10 mg by mouth daily.    . Multiple Vitamin (MULTIVITAMIN PO) Take by mouth.    . ondansetron (ZOFRAN-ODT) 8 MG disintegrating tablet Take 8 mg by mouth 3 (three) times daily as needed.    Marland Kitchen OVER THE COUNTER MEDICATION Take 1 tablet by mouth daily. Hair Finity     . scopolamine (TRANSDERM-SCOP, 1.5 MG,) 1 MG/3DAYS Transderm-Scop 1.5 mg transdermal patch (1 mg over 3 days)  UNWRAP AND APPLY 1 PATCH TO SKIN EVERY THIRD DAY    . tolterodine (DETROL LA) 4 MG 24 hr capsule Take 4 mg by mouth daily.    Marland Kitchen acyclovir (ZOVIRAX) 200 MG capsule Take 400 mg by mouth 3 (three) times daily.    Marland Kitchen EPINEPHrine 0.3 mg/0.3 mL IJ SOAJ injection Inject 0.3 mLs (0.3 mg total) into the muscle as needed for anaphylaxis. 1 each 3  . fluticasone furoate-vilanterol (BREO ELLIPTA) 100-25 MCG/INH AEPB Inhale 1 puff into the  lungs daily. 28 each 5  . Fluticasone Propionate (XHANCE) 93 MCG/ACT EXHU Place 2 sprays into the nose 2 (two) times daily. 32 mL 5  . olopatadine (PATANOL) 0.1 % ophthalmic solution Place 1 drop into both eyes 2 (two) times daily as needed for allergies (itchy/watery eyes). 5 mL 5   No current facility-administered medications for this visit.   Allergies: Allergies  Allergen Reactions  . Cinnamon Anaphylaxis  . Nutmeg Oil (Myristica Oil) Anaphylaxis  . Doxycycline Nausea And Vomiting  . Penicillins Hives and Itching    Fever, chills   Social History: Social History   Socioeconomic History  . Marital status: Single    Spouse name: Not on file  . Number of children: Not on file  . Years of education: Not on file  . Highest education level: Not on file  Occupational History  . Not on file  Tobacco Use  . Smoking status: Former Smoker    Packs/day: 0.25    Years: 10.00    Pack years: 2.50    Types: Cigarettes    Quit date: 08/22/2017    Years since quitting: 1.5  . Smokeless tobacco: Never Used  Substance and Sexual Activity  . Alcohol use: Yes    Alcohol/week: 5.0 standard drinks    Types: 3 Glasses of wine, 2 Shots of liquor per week  . Drug use: Not Currently    Types: Marijuana  . Sexual activity: Yes    Birth control/protection: Injection  Other Topics Concern  . Not on file  Social History Narrative  . Not on file   Social Determinants of Health   Financial Resource Strain:   . Difficulty of Paying Living Expenses: Not on file  Food Insecurity:   . Worried About Charity fundraiser in the Last Year: Not on file  . Ran Out of Food in the Last Year: Not on file  Transportation Needs:   . Lack of Transportation (Medical): Not on file  . Lack of Transportation (Non-Medical): Not on file  Physical Activity:   . Days of Exercise per Week: Not on file  . Minutes of Exercise per Session: Not on file  Stress:   . Feeling of Stress : Not on file  Social  Connections:   . Frequency of Communication with Friends and Family: Not on file  . Frequency of Social Gatherings with Friends and Family: Not on file  . Attends Religious Services: Not on file  . Active Member of Clubs or Organizations: Not on file  . Attends Banker Meetings: Not on file  . Marital Status: Not on file   Lives in a house which is about 29 years. Smoking: quit Occupation: Paediatric nurse History: Water Damage/mildew in the house: no Carpet in the family room: yes Carpet in the bedroom: yes Heating: electric Cooling: central Pet: no  Family History: Family History  Problem Relation Age of Onset  . Allergic rhinitis Mother   . Asthma Mother   . Allergic rhinitis Brother   . Asthma Maternal Grandfather   . Allergic rhinitis Maternal Grandfather   . Angioedema Neg Hx   . Eczema Neg Hx   . Immunodeficiency Neg Hx   . Urticaria Neg Hx    Review of Systems  Constitutional: Negative for appetite change, chills, fever and unexpected weight change.  HENT: Positive for congestion, rhinorrhea and sneezing.   Eyes: Positive for itching.  Respiratory: Positive for cough, chest tightness, shortness of breath and wheezing.   Cardiovascular: Negative for chest pain.  Gastrointestinal: Negative for abdominal pain.  Genitourinary: Negative for difficulty urinating.  Skin: Negative for rash.  Allergic/Immunologic: Positive for environmental allergies and food allergies.  Neurological: Positive for headaches.   Objective: BP 104/78 (BP Location: Right Arm, Patient Position: Sitting, Cuff Size: Normal)   Pulse 90   Temp 98.2 F (36.8 C) (Oral)   Resp 16   Ht 5' 4.4" (1.636 m)   Wt 144 lb 3.2 oz (65.4 kg)   SpO2 98%   BMI 24.45 kg/m  Body mass index is 24.45 kg/m. Physical Exam  Constitutional: She is oriented to person, place, and time. She appears well-developed and well-nourished.  HENT:  Head: Normocephalic and atraumatic.   Right Ear: External ear normal.  Left Ear: External ear normal.  Nose: Nose normal.  Mouth/Throat: Oropharynx is clear and moist.  Eyes: Conjunctivae and EOM are normal.  Cardiovascular: Normal rate, regular rhythm and normal heart sounds. Exam reveals no gallop and no friction rub.  No murmur heard. Pulmonary/Chest: Effort normal and breath sounds normal. She has no wheezes. She has no rales.  Abdominal: Soft.  Musculoskeletal:     Cervical back: Neck supple.  Neurological: She is alert and oriented to person, place, and time.  Skin: Skin is warm. No rash noted.  Multiple tattoos on back and arms.   Psychiatric: She has a normal mood and affect. Her behavior is normal.  Nursing note and vitals reviewed.  The plan was reviewed with the patient/family, and all questions/concerned were addressed.  It was my pleasure to see Brittany Watts today and participate in her care. Please feel free to contact me with any questions or concerns.  Sincerely,  Wyline Mood, DO Allergy & Immunology  [facility] office: [phone] [facility] office: [phone] [place] office: [phone]

## 2019-03-04 ENCOUNTER — Ambulatory Visit (INDEPENDENT_AMBULATORY_CARE_PROVIDER_SITE_OTHER): Payer: BC Managed Care – PPO | Admitting: Allergy

## 2019-03-04 ENCOUNTER — Encounter: Payer: Self-pay | Admitting: Allergy

## 2019-03-04 ENCOUNTER — Other Ambulatory Visit: Payer: Self-pay

## 2019-03-04 VITALS — BP 104/78 | HR 90 | Temp 98.2°F | Resp 16 | Ht 64.4 in | Wt 144.2 lb

## 2019-03-04 DIAGNOSIS — J4540 Moderate persistent asthma, uncomplicated: Secondary | ICD-10-CM | POA: Diagnosis not present

## 2019-03-04 DIAGNOSIS — Z91030 Bee allergy status: Secondary | ICD-10-CM

## 2019-03-04 DIAGNOSIS — Z880 Allergy status to penicillin: Secondary | ICD-10-CM

## 2019-03-04 DIAGNOSIS — H1013 Acute atopic conjunctivitis, bilateral: Secondary | ICD-10-CM | POA: Diagnosis not present

## 2019-03-04 DIAGNOSIS — T7800XD Anaphylactic reaction due to unspecified food, subsequent encounter: Secondary | ICD-10-CM | POA: Insufficient documentation

## 2019-03-04 DIAGNOSIS — J3089 Other allergic rhinitis: Secondary | ICD-10-CM

## 2019-03-04 MED ORDER — OLOPATADINE HCL 0.1 % OP SOLN: 1.0000 [drp] | Freq: Two times a day (BID) | OPHTHALMIC | 5 refills | Status: DC | PRN

## 2019-03-04 MED ORDER — XHANCE 93 MCG/ACT NA EXHU: 2.0000 | INHALANT_SUSPENSION | Freq: Two times a day (BID) | NASAL | 5 refills | Status: DC

## 2019-03-04 MED ORDER — BREO ELLIPTA 100-25 MCG/INH IN AEPB: 1.0000 | INHALATION_SPRAY | Freq: Every day | RESPIRATORY_TRACT | 5 refills | Status: DC

## 2019-03-04 MED ORDER — LEVOCETIRIZINE DIHYDROCHLORIDE 5 MG PO TABS: 5.0000 mg | ORAL_TABLET | Freq: Every day | ORAL | 5 refills | Status: DC

## 2019-03-04 MED ORDER — EPINEPHRINE 0.3 MG/0.3ML IJ SOAJ: 0.3000 mg | INTRAMUSCULAR | 3 refills | Status: DC | PRN

## 2019-03-04 NOTE — Assessment & Plan Note (Signed)
   See assessment and plan as above allergic rhinitis. 

## 2019-03-04 NOTE — Assessment & Plan Note (Signed)
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  Patient used to have EpiPen but no longer has one.  Avoid cinnamon and nutmeg. Will check via bloodwork.   I have prescribed epinephrine injectable and demonstrated proper use. For mild symptoms you can take over the counter antihistamines such as Benadryl and monitor symptoms closely. If symptoms worsen or if you have severe symptoms including breathing issues, throat closure, significant swelling, whole body hives, severe diarrhea and vomiting, lightheadedness then inject epinephrine and seek immediate medical care afterwards.  Food action plan given.

## 2019-03-04 NOTE — Patient Instructions (Addendum)
Asthma . Daily controller medication(s): start Breo 100 1 puff daily and rinse mouth afterwards. Sample given, demonstrated proper use.  . Prior to physical activity: May use albuterol rescue inhaler 2 puffs 5 to 15 minutes prior to strenuous physical activities. Marland Kitchen. Rescue medications: May use albuterol rescue inhaler 2 puffs or nebulizer every 4 to 6 hours as needed for shortness of breath, chest tightness, coughing, and wheezing. Monitor frequency of use.  . Asthma control goals:  o Full participation in all desired activities (may need albuterol before activity) o Albuterol use two times or less a week on average (not counting use with activity) o Cough interfering with sleep two times or less a month o Oral steroids no more than once a year o No hospitalizations  Allergic rhinitis  Start Xhance 2 sprays per nostril twice a day for nasal symptoms.  Sample given, demonstrated proper use.   Nasal saline spray (i.e., Simply Saline) or nasal saline lavage (i.e., NeilMed) is recommended as needed and prior to medicated nasal sprays.  May use over the counter antihistamines such as Zyrtec (cetirizine), Claritin (loratadine), Allegra (fexofenadine), or Xyzal (levocetirizine) daily as needed and may take twice a day for breakthrough symptoms.   Continue Singulair 10mg  daily at night.  May use Patanol 0.1% 1 drop in each eye twice daily as needed for itchy/watery eyes.   Do not put over contacts. Wait about 15 minutes after eye drops to put contacts back in.  Start environmental control measures - for pollen, dust mite, cat, dog, cockroach, mold.   Food allergy  Avoid cinnamon and nutmeg.  I have prescribed epinephrine injectable and demonstrated proper use. For mild symptoms you can take over the counter antihistamines such as Benadryl and monitor symptoms closely. If symptoms worsen or if you have severe symptoms including breathing issues, throat closure, significant swelling, whole body  hives, severe diarrhea and vomiting, lightheadedness then inject epinephrine and seek immediate medical care afterwards.  Food action plan given.  Bee stings  Avoid bee stings.  Have epinephrine injectable device on hand if needed.   . Get bloodwork - will make additional recommendations based on results.  o We are ordering labs, so please allow 1-2 weeks for the results to come back. o With the newly implemented Cures Act, the labs might be visible to you at the same time that they become visible to me. However, I will not address the results until all of the results are back, so please be patient.  o In the meantime, continue recommendations in your patient instructions, including avoidance measures (if applicable), until you hear from me.  Follow up in 1 month or sooner if needed.  Reducing Pollen Exposure . Pollen seasons: trees (spring), grass (summer) and ragweed/weeds (fall). Marland Kitchen. Keep windows closed in your home and car to lower pollen exposure.  Lilian Kapur. Install air conditioning in the bedroom and throughout the house if possible.  . Avoid going out in dry windy days - especially early morning. . Pollen counts are highest between 5 - 10 AM and on dry, hot and windy days.  . Save outside activities for late afternoon or after a heavy rain, when pollen levels are lower.  . Avoid mowing of grass if you have grass pollen allergy. Marland Kitchen. Be aware that pollen can also be transported indoors on people and pets.  . Dry your clothes in an automatic dryer rather than hanging them outside where they might collect pollen.  . Rinse hair and eyes before  bedtime. Control of House Dust Mite Allergen . Dust mite allergens are a common trigger of allergy and asthma symptoms. While they can be found throughout the house, these microscopic creatures thrive in warm, humid environments such as bedding, upholstered furniture and carpeting. . Because so much time is spent in the bedroom, it is essential to reduce  mite levels there.  . Encase pillows, mattresses, and box springs in special allergen-proof fabric covers or airtight, zippered plastic covers.  . Bedding should be washed weekly in hot water (130 F) and dried in a hot dryer. Allergen-proof covers are available for comforters and pillows that can't be regularly washed.  Reyes Ivan the allergy-proof covers every few months. Minimize clutter in the bedroom. Keep pets out of the bedroom.  Marland Kitchen Keep humidity less than 50% by using a dehumidifier or air conditioning. You can buy a humidity measuring device called a hygrometer to monitor this.  . If possible, replace carpets with hardwood, linoleum, or washable area rugs. If that's not possible, vacuum frequently with a vacuum that has a HEPA filter. . Remove all upholstered furniture and non-washable window drapes from the bedroom. . Remove all non-washable stuffed toys from the bedroom.  Wash stuffed toys weekly. Pet Allergen Avoidance: . Contrary to popular opinion, there are no "hypoallergenic" breeds of dogs or cats. That is because people are not allergic to an animal's hair, but to an allergen found in the animal's saliva, dander (dead skin flakes) or urine. Pet allergy symptoms typically occur within minutes. For some people, symptoms can build up and become most severe 8 to 12 hours after contact with the animal. People with severe allergies can experience reactions in public places if dander has been transported on the pet owners' clothing. Marland Kitchen Keeping an animal outdoors is only a partial solution, since homes with pets in the yard still have higher concentrations of animal allergens. . Before getting a pet, ask your allergist to determine if you are allergic to animals. If your pet is already considered part of your family, try to minimize contact and keep the pet out of the bedroom and other rooms where you spend a great deal of time. . As with dust mites, vacuum carpets often or replace carpet with a  hardwood floor, tile or linoleum. . High-efficiency particulate air (HEPA) cleaners can reduce allergen levels over time. . While dander and saliva are the source of cat and dog allergens, urine is the source of allergens from rabbits, hamsters, mice and [place] pigs; so ask a non-allergic family member to clean the animal's cage. . If you have a pet allergy, talk to your allergist about the potential for allergy immunotherapy (allergy shots). This strategy can often provide long-term relief. Cockroach Allergen Avoidance Cockroaches are often found in the homes of densely populated urban areas, schools or commercial buildings, but these creatures can lurk almost anywhere. This does not mean that you have a dirty house or living area. . Block all areas where roaches can enter the home. This includes crevices, wall cracks and windows.  . Cockroaches need water to survive, so fix and seal all leaky faucets and pipes. Have an exterminator go through the house when your family and pets are gone to eliminate any remaining roaches. Marland Kitchen Keep food in lidded containers and put pet food dishes away after your pets are done eating. Vacuum and sweep the floor after meals, and take out garbage and recyclables. Use lidded garbage containers in the kitchen. Wash dishes immediately  after use and clean under stoves, refrigerators or toasters where crumbs can accumulate. Wipe off the stove and other kitchen surfaces and cupboards regularly. Mold Control . Mold and fungi can grow on a variety of surfaces provided certain temperature and moisture conditions exist.  . Outdoor molds grow on plants, decaying vegetation and soil. The major outdoor mold, Alternaria and Cladosporium, are found in very high numbers during hot and dry conditions. Generally, a late summer - fall peak is seen for common outdoor fungal spores. Rain will temporarily lower outdoor mold spore count, but counts rise rapidly when the rainy period ends. . The  most important indoor molds are Aspergillus and Penicillium. Dark, humid and poorly ventilated basements are ideal sites for mold growth. The next most common sites of mold growth are the bathroom and the kitchen. Outdoor (Seasonal) Mold Control . Use air conditioning and keep windows closed. . Avoid exposure to decaying vegetation. Marland Kitchen Avoid leaf raking. . Avoid grain handling. . Consider wearing a face mask if working in moldy areas.  Indoor (Perennial) Mold Control  . Maintain humidity below 50%. . Get rid of mold growth on hard surfaces with water, detergent and, if necessary, 5% bleach (do not mix with other cleaners). Then dry the area completely. If mold covers an area more than 10 square feet, consider hiring an indoor environmental professional. . For clothing, washing with soap and water is best. If moldy items cannot be cleaned and dried, throw them away. . Remove sources e.g. contaminated carpets. . Repair and seal leaking roofs or pipes. Using dehumidifiers in damp basements may be helpful, but empty the water and clean units regularly to prevent mildew from forming. All rooms, especially basements, bathrooms and kitchens, require ventilation and cleaning to deter mold and mildew growth. Avoid carpeting on concrete or damp floors, and storing items in damp areas.

## 2019-03-04 NOTE — Assessment & Plan Note (Signed)
Patient was diagnosed with asthma over 10 years ago and currently using albuterol a few times a week with some benefit.  Triggers are rain, allergies and pet exposure.  The past year she had at least 3 courses of prednisone due to asthma exacerbations.  Today's spirometry was normal with no improvement in FEV1 post bronchodilator treatment. . Daily controller medication(s): start Breo 100 1 puff daily and rinse mouth afterwards. Sample given, demonstrated proper use.  . Prior to physical activity: May use albuterol rescue inhaler 2 puffs 5 to 15 minutes prior to strenuous physical activities. Marland Kitchen Rescue medications: May use albuterol rescue inhaler 2 puffs or nebulizer every 4 to 6 hours as needed for shortness of breath, chest tightness, coughing, and wheezing. Monitor frequency of use.  . Repeat spirometry at next visit.

## 2019-03-04 NOTE — Assessment & Plan Note (Addendum)
Perennial rhinoconjunctivitis symptoms for at least 15 years with worsening with weather change.  Tried Xyzal, Singulair, Dymista, Flonase and Nasacort with some benefit.  Skin testing in 2011 was positive to grass, weed, trees, dust mites, cat, dog. 2013 testing was positive to cat, grass, dust mite, trees, ragweed, weed, cockroach, mold, horse, feather. Patient was on allergy injection for a few years but stopped due to localized reactions.  Unable to skin prick test today due to extensive tattoos on the back and upper arm.  We will get blood work instead and make additional recommendations based on results.  Start Xhance 2 sprays per nostril twice a day for nasal symptoms.  Sample given, demonstrated proper use.   Nasal saline spray (i.e., Simply Saline) or nasal saline lavage (i.e., NeilMed) is recommended as needed and prior to medicated nasal sprays.  May use over the counter antihistamines such as Zyrtec (cetirizine), Claritin (loratadine), Allegra (fexofenadine), or Xyzal (levocetirizine) daily as needed and may take twice a day for breakthrough symptoms.   Continue Singulair 10mg  daily at night.  May use Patanol 0.1% 1 drop in each eye twice daily as needed for itchy/watery eyes.   Do not put over contacts. Wait about 15 minutes after eye drops to put contacts back in.  Start environmental control measures.

## 2019-03-04 NOTE — Assessment & Plan Note (Signed)
Reactions as a child with shortness of breath and lip swelling requiring ER visit.  No previous evaluation.  Avoid insect/bee stings.  I have prescribed epinephrine injectable and demonstrated proper use. For mild symptoms you can take over the counter antihistamines such as Benadryl and monitor symptoms closely. If symptoms worsen or if you have severe symptoms including breathing issues, throat closure, significant swelling, whole body hives, severe diarrhea and vomiting, lightheadedness then inject epinephrine and seek immediate medical care afterwards.

## 2019-03-04 NOTE — Assessment & Plan Note (Signed)
Broke out in hives at the age of 38 and no penicillin type antibiotics since then.  Continue avoidance and consider penicillin testing in future.

## 2019-03-06 LAB — ALLERGEN HYMENOPTERA PANEL
Bumblebee: <0.1 kU/L
Honeybee IgE: <0.1 kU/L
Hornet, White Face, IgE: 0.1 kU/L — AB
Hornet, Yellow, IgE: <0.1 kU/L
Paper Wasp IgE: <0.1 kU/L
Yellow Jacket, IgE: <0.1 kU/L

## 2019-03-06 LAB — ALLERGENS W/TOTAL IGE AREA 2
Alternaria Alternata IgE: <0.1 kU/L
Aspergillus Fumigatus IgE: <0.1 kU/L
Bermuda Grass IgE: <0.1 kU/L
Cat Dander IgE: <0.1 kU/L
Cedar, Mountain IgE: <0.1 kU/L
Cladosporium Herbarum IgE: <0.1 kU/L
Cockroach, German IgE: <0.1 kU/L
Common Silver Birch IgE: <0.1 kU/L
Cottonwood IgE: <0.1 kU/L
D Farinae IgE: 31.2 kU/L — AB
D Pteronyssinus IgE: 17 kU/L — AB
Dog Dander IgE: <0.1 kU/L
Elm, American IgE: <0.1 kU/L
IgE (Immunoglobulin E), Serum: 81 IU/mL (ref 6–495)
Johnson Grass IgE: <0.1 kU/L
Maple/Box Elder IgE: <0.1 kU/L
Mouse Urine IgE: <0.1 kU/L
Oak, White IgE: <0.1 kU/L
Pecan, Hickory IgE: <0.1 kU/L
Penicillium Chrysogen IgE: <0.1 kU/L
Pigweed, Rough IgE: <0.1 kU/L
Ragweed, Short IgE: <0.1 kU/L
Sheep Sorrel IgE Qn: <0.1 kU/L
Timothy Grass IgE: <0.1 kU/L
White Mulberry IgE: <0.1 kU/L

## 2019-03-06 LAB — TRYPTASE: Tryptase: 4.8 ug/L (ref 2.2–13.2)

## 2019-03-06 LAB — ALLERGEN, CINNAMON, RF220: Allergen Cinnamon IgE: <0.1 kU/L

## 2019-03-06 LAB — ALLERGEN, NUTMEG, RF282: F282-IgE Nutmeg: <0.1 kU/L

## 2019-03-07 ENCOUNTER — Telehealth: Payer: Self-pay

## 2019-03-07 ENCOUNTER — Encounter: Payer: Self-pay | Admitting: *Deleted

## 2019-03-07 NOTE — Telephone Encounter (Signed)
PA for Brittany Watts was initiated a waiting approval.

## 2019-03-09 ENCOUNTER — Telehealth: Payer: Self-pay | Admitting: Allergy

## 2019-03-09 NOTE — Telephone Encounter (Signed)
PA was approved. 

## 2019-03-09 NOTE — Telephone Encounter (Signed)
Noted  

## 2019-03-09 NOTE — Telephone Encounter (Signed)
Pharmacy courtesy call letting us know Truett Perna PA was approved.

## 2019-03-24 IMAGING — US ULTRASOUND RIGHT BREAST LIMITED
1 series · 5 of 5 positions shown · non-contrast
Comparison: Previous exam(s).

ADDENDUM:
The impression should read: The mass in the right breast at 4
o'clock is smaller than on prior exams, and has been monitored for 2
years. This is a benign finding.
CLINICAL DATA: 32-year-old female presenting for final follow-up of
a probably benign mass in the right breast.

EXAM:
ULTRASOUND OF THE RIGHT BREAST

[Series 1: ultrasound right breast limited · 0.06mm/px · 5 of 5 slices shown]
[im 1/5]
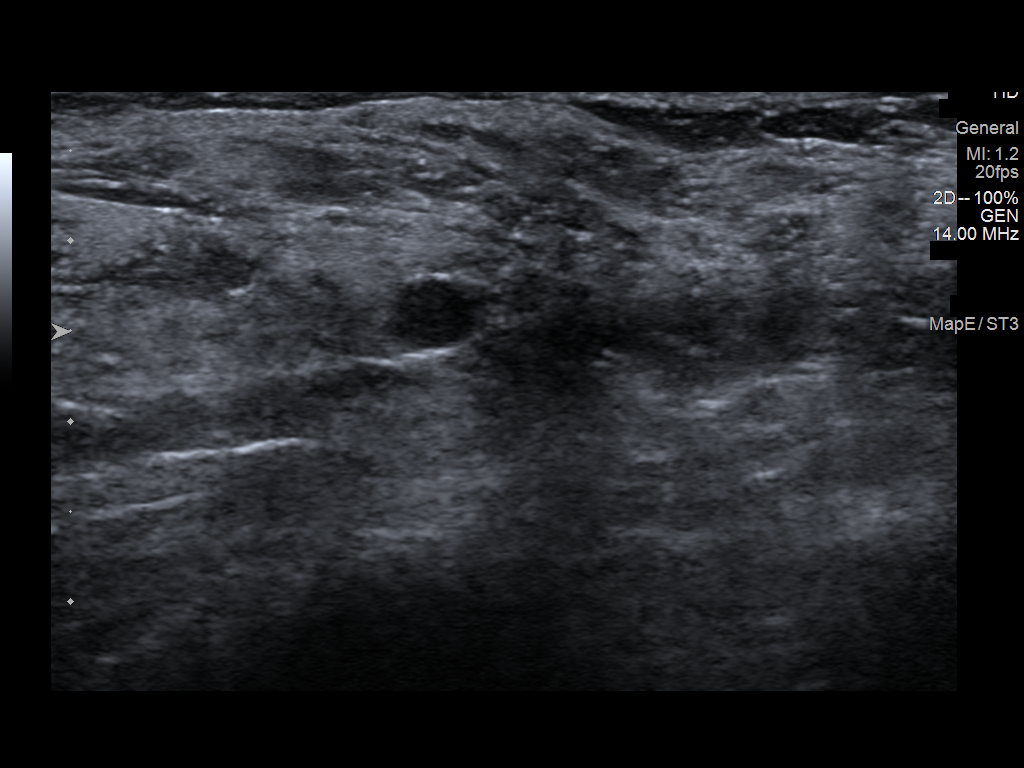
[im 2/5]
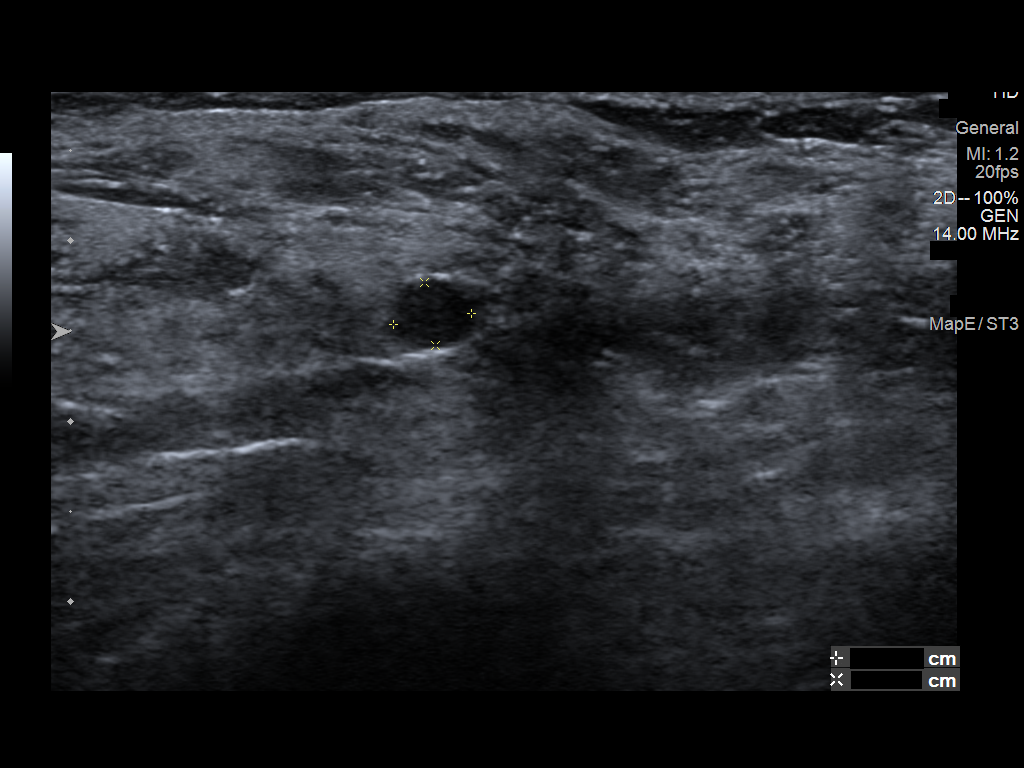
[im 3/5]
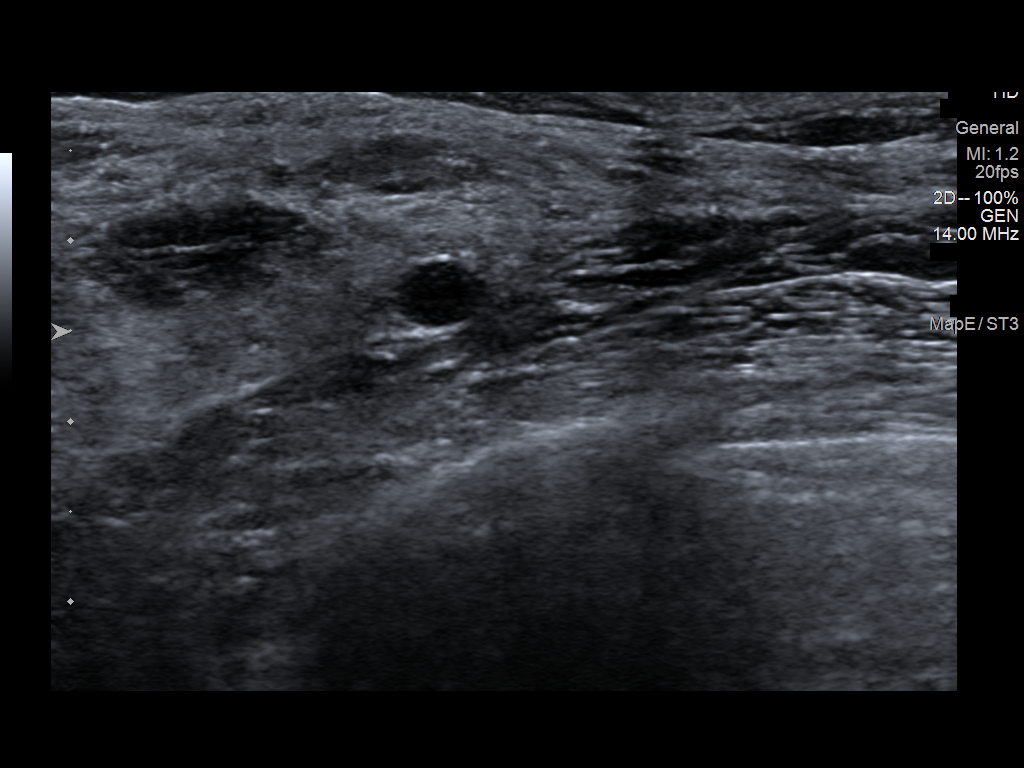
[im 4/5]
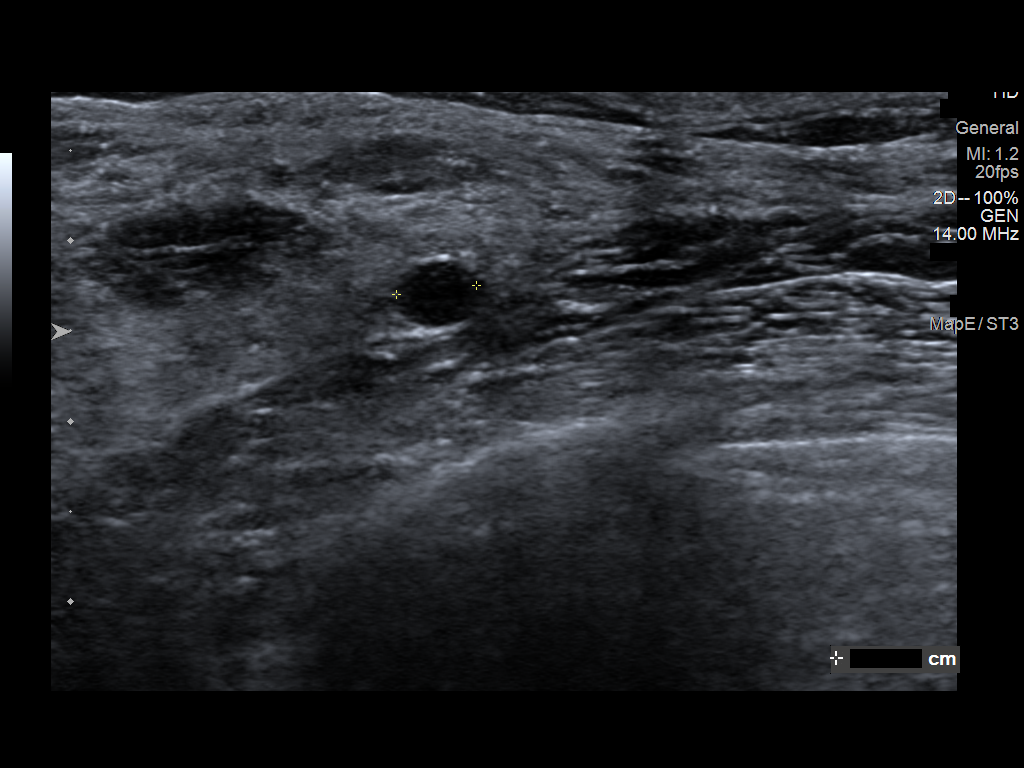
[im 5/5]
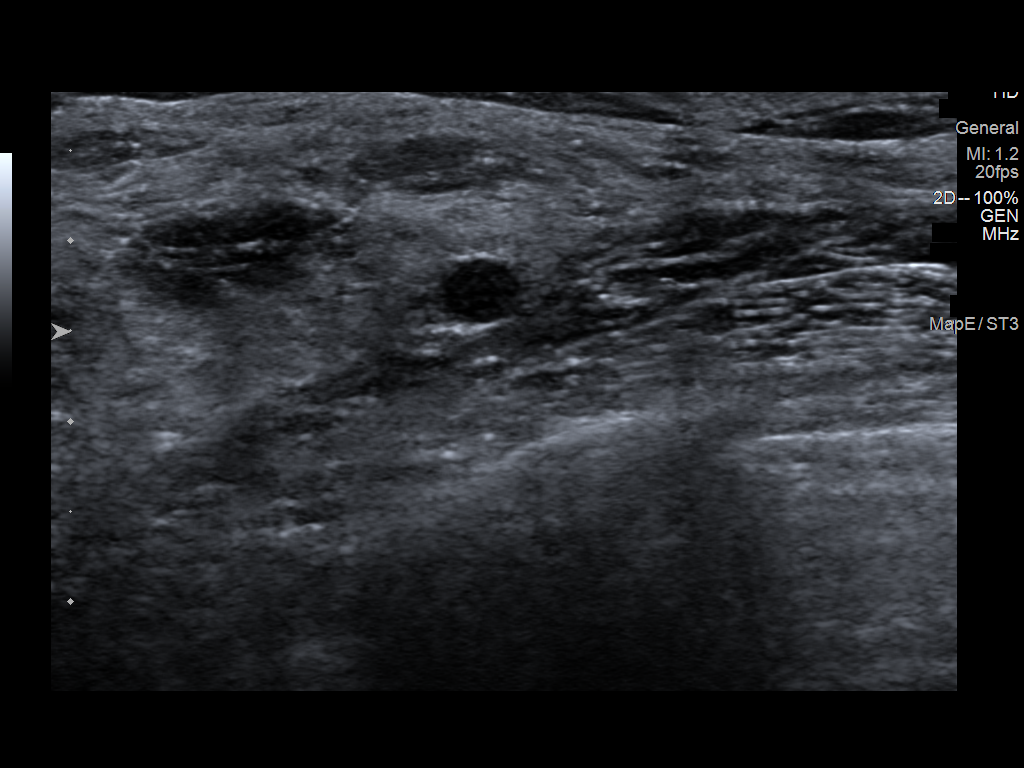

[5 of 5 positions shown; findings below may reference images not displayed]

FINDINGS: Ultrasound of the right breast at 3 o'clock, 3 cm from the nipple
demonstrates a stable oval circumscribed mass measuring 5 x 4 x 4
mm, previously 8 x 5 x 7 mm.
IMPRESSION: The mass in the right breast at 4 o'clock is smaller than on the
prior exam Jawin has been monitored for 2 years. This is a benign
finding.

RECOMMENDATION:
Screening mammogram at age 39 unless there are persistent or
intervening clinical concerns. (Code:[id number])

I have discussed the findings and recommendations with the patient.
Results were also provided in writing at the conclusion of the
visit. If applicable, a reminder letter will be sent to the patient
regarding the next appointment.

BI-RADS CATEGORY  2: Benign.

## 2019-04-08 ENCOUNTER — Ambulatory Visit: Payer: BC Managed Care – PPO | Admitting: Allergy

## 2019-04-08 ENCOUNTER — Encounter: Payer: Self-pay | Admitting: Allergy

## 2019-04-08 ENCOUNTER — Other Ambulatory Visit: Payer: Self-pay

## 2019-04-08 DIAGNOSIS — Z91030 Bee allergy status: Secondary | ICD-10-CM

## 2019-04-08 DIAGNOSIS — T7800XD Anaphylactic reaction due to unspecified food, subsequent encounter: Secondary | ICD-10-CM | POA: Diagnosis not present

## 2019-04-08 DIAGNOSIS — H1013 Acute atopic conjunctivitis, bilateral: Secondary | ICD-10-CM | POA: Diagnosis not present

## 2019-04-08 DIAGNOSIS — Z880 Allergy status to penicillin: Secondary | ICD-10-CM

## 2019-04-08 DIAGNOSIS — J4540 Moderate persistent asthma, uncomplicated: Secondary | ICD-10-CM | POA: Insufficient documentation

## 2019-04-08 DIAGNOSIS — J3089 Other allergic rhinitis: Secondary | ICD-10-CM

## 2019-04-08 MED ORDER — ODACTRA 12 SQ-HDM SL SUBL: 1.0000 | SUBLINGUAL_TABLET | Freq: Every day | SUBLINGUAL | 5 refills | Status: DC

## 2019-04-08 MED ORDER — XHANCE 93 MCG/ACT NA EXHU: 2.0000 | INHALANT_SUSPENSION | Freq: Two times a day (BID) | NASAL | 5 refills | Status: DC

## 2019-04-08 NOTE — Assessment & Plan Note (Signed)
Past history - Perennial rhinoconjunctivitis symptoms for at least 15 years with worsening with weather change.  Tried Xyzal, Singulair, Dymista, Flonase and Nasacort with some benefit.  Skin testing in 2011 was positive to grass, weed, trees, dust mites, cat, dog. 2013 testing was positive to cat, grass, dust mite, trees, ragweed, weed, cockroach, mold, horse, feather. Patient was on allergy injection for a few years but stopped due to localized reactions. Interim history - 2020 bloodwork only positive to dust mites. Janyth Pupa and would like to start oral immunotherapy with Isaiah Serge.  Read about Isaiah Serge oral immunotherapy for dust mites.  I will send into your pharmacy and see if your insurance covers it.  First dose must be taken in the office with 30 minute wait period, then you continue once a day for 3-5 years.   Continue environmental control measures.   Continue Xhance 2 sprays per nostril twice a day for nasal symptoms.  Nasal saline spray (i.e., Simply Saline) or nasal saline lavage (i.e., NeilMed) is recommended as needed and prior to medicated nasal sprays.  May use over the counter antihistamines such as Zyrtec (cetirizine), Claritin (loratadine), Allegra (fexofenadine), or Xyzal (levocetirizine) daily as needed and may take twice a day for breakthrough symptoms.   Continue Singulair 10mg  daily at night.  May use Patanol 0.1% 1 drop in each eye twice daily as needed for itchy/watery eyes.  ? Do not put over contacts. Wait about 15 minutes after eye drops to put contacts back in.

## 2019-04-08 NOTE — Assessment & Plan Note (Signed)
Past history - Reactions as a child with shortness of breath and lip swelling requiring ER visit.   Interim history - 2020 hymenoptera panel was negative.   Avoid insect/bee stings.  For mild symptoms you can take over the counter antihistamines such as Benadryl and monitor symptoms closely. If symptoms worsen or if you have severe symptoms including breathing issues, throat closure, significant swelling, whole body hives, severe diarrhea and vomiting, lightheadedness then inject epinephrine and seek immediate medical care afterwards.

## 2019-04-08 NOTE — Assessment & Plan Note (Signed)
Past history - Patient was diagnosed with asthma over 10 years ago. Triggers are rain, allergies and pet exposure.  The past year she had at least 3 courses of prednisone due to asthma exacerbations. 2020 spirometry was normal with no improvement in FEV1 post bronchodilator treatment. Interim history - Stable with below regimen.  Today's spirometry was normal and improved from previous one. ACT score 22.  Daily controller medication(s):continue Breo 100 1 puff daily and rinse mouth afterwards.   Prior to physical activity:May use albuterol rescue inhaler 2 puffs 5 to 15 minutes prior to strenuous physical activities.  Rescue medications:May use albuterol rescue inhaler 2 puffs or nebulizer every 4 to 6 hours as needed for shortness of breath, chest tightness, coughing, and wheezing. Monitor frequency of use.

## 2019-04-08 NOTE — Progress Notes (Signed)
Follow Up Note  RE: Brittany Watts MRN: [id number] DOB: [birth year] Date of Office Visit: 04/08/2019  Referring provider: Garlan Fillers, MD Primary care provider: Garlan Fillers, MD  Chief Complaint: Allergic Rhinitis  (doing well) and Asthma  History of Present Illness: I had the pleasure of seeing Brittany Watts for a follow up visit at the [facility] on 04/08/2019. She is a 34 y.o. female, who is being followed for asthma, allergic rhinoconjunctivitis, food allergies, bee sting reactions and history of penicillin allergy. Her previous allergy office visit was on 03/04/2019 with Dr. Maudie Mercury. Today is a regular follow up visit.  Asthma:  Currently on Breo 100 1 puff daily and breathing is much better.  Denies any SOB, coughing, wheezing, chest tightness, nocturnal awakenings, ER/urgent care visits or prednisone use since the last visit. Has not needed to use albuterol.   Other allergic rhinitis Patient tried Xhance 2 sprays twice a day for 1 month which helped. Did not pick up refill as it was $50 copay.  No increase in nosebleeds.  Takes Xyzal and Singulair daily with good benefit. Used eye drops as needed with good benefit.  Interested in starting the oral pills for dust mites if insurance covers it.  Allergy with anaphylaxis due to food, subsequent encounter Avoiding cinnamon and nutmeg. No reactions since last OV.  Bee sting allergy No stings since the last visit.   Assessment and Plan: Shalay is a 34 y.o. female with: Moderate persistent asthma Past history - Patient was diagnosed with asthma over 10 years ago. Triggers are rain, allergies and pet exposure.  The past year she had at least 3 courses of prednisone due to asthma exacerbations. 2020 spirometry was normal with no improvement in FEV1 post bronchodilator treatment. Interim history - Stable with below regimen.  Today's spirometry was normal and improved from previous one. ACT  score 22.  Daily controller medication(s):continue Breo 100 1 puff daily and rinse mouth afterwards.   Prior to physical activity:May use albuterol rescue inhaler 2 puffs 5 to 15 minutes prior to strenuous physical activities.  Rescue medications:May use albuterol rescue inhaler 2 puffs or nebulizer every 4 to 6 hours as needed for shortness of breath, chest tightness, coughing, and wheezing. Monitor frequency of use.   Perennial allergic rhinitis Past history - Perennial rhinoconjunctivitis symptoms for at least 15 years with worsening with weather change.  Tried Xyzal, Singulair, Dymista, Flonase and Nasacort with some benefit.  Skin testing in 2011 was positive to grass, weed, trees, dust mites, cat, dog. 2013 testing was positive to cat, grass, dust mite, trees, ragweed, weed, cockroach, mold, horse, feather. Patient was on allergy injection for a few years but stopped due to localized reactions. Interim history - 2020 bloodwork only positive to dust mites. [name] and would like to start oral immunotherapy with Kathlene November.  Read about Kathlene November oral immunotherapy for dust mites.  I will send into your pharmacy and see if your insurance covers it.  First dose must be taken in the office with 30 minute wait period, then you continue once a day for 3-5 years.   Continue environmental control measures.   Continue Xhance 2 sprays per nostril twice a day for nasal symptoms.  Nasal saline spray (i.e., Simply Saline) or nasal saline lavage (i.e., NeilMed) is recommended as needed and prior to medicated nasal sprays.  May use over the counter antihistamines such as Zyrtec (cetirizine), Claritin (loratadine), Allegra (fexofenadine), or Xyzal (levocetirizine) daily  as needed and may take twice a day for breakthrough symptoms.   Continue Singulair 10mg  daily at night.  May use Patanol 0.1% 1 drop in each eye twice daily as needed for itchy/watery eyes.  ? Do not put over contacts. Wait about  15 minutes after eye drops to put contacts back in.  Allergic conjunctivitis of both eyes  See assessment and plan as above allergic rhinitis.  Allergy with anaphylaxis due to food, subsequent encounter Past history - Anaphylactic reactions to cinnamon and nutmeg in the past.  Patient used to have EpiPen but no longer has one. Interim history - 2020 bloodwork negative to cinnamon and nutmeg.  Avoid cinnamon and nutmeg.   For mild symptoms you can take over the counter antihistamines such as Benadryl and monitor symptoms closely. If symptoms worsen or if you have severe symptoms including breathing issues, throat closure, significant swelling, whole body hives, severe diarrhea and vomiting, lightheadedness then inject epinephrine and seek immediate medical care afterwards.  Food action plan in place.   If interested we can schedule food challenges to cinnamon and nutmeg. Must be done on separate occasions. You must be off antihistamines for 3-5 days before. Plan on being in the office for 2-3 hours and must bring in the food you want to do the oral challenge for. You must call to scheduled an appointment and specify it's for a food challenge.   Bee sting allergy Past history - Reactions as a child with shortness of breath and lip swelling requiring ER visit.   Interim history - 2020 hymenoptera panel was negative.   Avoid insect/bee stings.  For mild symptoms you can take over the counter antihistamines such as Benadryl and monitor symptoms closely. If symptoms worsen or if you have severe symptoms including breathing issues, throat closure, significant swelling, whole body hives, severe diarrhea and vomiting, lightheadedness then inject epinephrine and seek immediate medical care afterwards.  History of penicillin allergy Past history - Broke out in hives at the age of 25 and no penicillin type antibiotics since then.  Continue avoidance and consider penicillin testing in  future.  Return in about 4 months (around 08/06/2019).  Meds ordered this encounter  Medications  . Dust Mite Mixed Allergen Ext (ODACTRA) 12 SQ-HDM SUBL    Sig: Place 1 tablet under the tongue daily. FIRST DOSE MUST BE TAKEN AT ALLERGIST'S OFFICE. Don't eat or drink for 5 minutes afterwards.    Dispense:  30 tablet    Refill:  5  . Fluticasone Propionate (XHANCE) 93 MCG/ACT EXHU    Sig: Place 2 sprays into the nose 2 (two) times daily.    Dispense:  32 mL    Refill:  5   Diagnostics: Spirometry:  Tracings reviewed. Her effort: Good reproducible efforts. FVC: 3.02L FEV1: 2.40L, 90% predicted FEV1/FVC ratio: 79% Interpretation: Spirometry consistent with normal pattern.  Please see scanned spirometry results for details.  Medication List:  Current Outpatient Medications  Medication Sig Dispense Refill  . albuterol (PROVENTIL HFA;VENTOLIN HFA) 108 (90 BASE) MCG/ACT inhaler Inhale 2 puffs into the lungs every 6 (six) hours as needed. Shortness of breath and wheezing     . albuterol (PROVENTIL) (2.5 MG/3ML) 0.083% nebulizer solution albuterol sulfate 2.5 mg/3 mL (0.083 %) solution for nebulization    . ALPRAZolam (XANAX) 0.5 MG tablet Take 0.5 mg by mouth 3 (three) times daily.    . APPLE CIDER VINEGAR PO Take by mouth.    08/08/2019 BIOTIN 5000 PO Take 1 tablet  by mouth daily.      . COLLAGEN PO Take by mouth.    . EPINEPHrine 0.3 mg/0.3 mL IJ SOAJ injection Inject 0.3 mLs (0.3 mg total) into the muscle as needed for anaphylaxis. 1 each 3  . escitalopram (LEXAPRO) 10 MG tablet Take 10 mg by mouth daily.    . fluticasone furoate-vilanterol (BREO ELLIPTA) 100-25 MCG/INH AEPB Inhale 1 puff into the lungs daily. 28 each 5  . Fluticasone Propionate (XHANCE) 93 MCG/ACT EXHU Place 2 sprays into the nose 2 (two) times daily. 32 mL 5  . levocetirizine (XYZAL) 5 MG tablet Take 1 tablet (5 mg total) by mouth daily. 30 tablet 5  . Linoleic Acid-Sunflower Oil (CLA PO) Take by mouth.    . meclizine  (ANTIVERT) 12.5 MG tablet TAKE ONE TABLET BY MOUTH THREE TIMES A DAY FOR 20 DAYS    . medroxyPROGESTERone (DEPO-PROVERA) 150 MG/ML injection Inject 150 mg into the muscle every 3 (three) months.      . montelukast (SINGULAIR) 10 MG tablet Take 10 mg by mouth daily.    . Multiple Vitamin (MULTIVITAMIN PO) Take by mouth.    Marland Kitchen olopatadine (PATANOL) 0.1 % ophthalmic solution Place 1 drop into both eyes 2 (two) times daily as needed for allergies (itchy/watery eyes). 5 mL 5  . ondansetron (ZOFRAN-ODT) 8 MG disintegrating tablet Take 8 mg by mouth 3 (three) times daily as needed.    Marland Kitchen scopolamine (TRANSDERM-SCOP, 1.5 MG,) 1 MG/3DAYS Transderm-Scop 1.5 mg transdermal patch (1 mg over 3 days)  UNWRAP AND APPLY 1 PATCH TO SKIN EVERY THIRD DAY    . tolterodine (DETROL LA) 4 MG 24 hr capsule Take 4 mg by mouth daily.    Marland Kitchen topiramate (TOPAMAX) 25 MG capsule Take 25 mg by mouth at bedtime.    . benzonatate (TESSALON) 200 MG capsule Take 400 mg by mouth every 8 (eight) hours as needed.    . Butalbital-APAP-Caffeine 50-325-40 MG capsule butalbital-acetaminophen-caffeine 50 mg-325 mg-40 mg capsule  TAKE ONE CAPSULE BY MOUTH EVERY 6 HOURS AS NEEDED FOR HEADACHE    . Dust Mite Mixed Allergen Ext (ODACTRA) 12 SQ-HDM SUBL Place 1 tablet under the tongue daily. FIRST DOSE MUST BE TAKEN AT ALLERGIST'S OFFICE. Don't eat or drink for 5 minutes afterwards. 30 tablet 5   No current facility-administered medications for this visit.   Allergies: Allergies  Allergen Reactions  . Cinnamon Anaphylaxis  . Nutmeg Oil (Myristica Oil) Anaphylaxis  . Doxycycline Nausea And Vomiting  . Penicillins Hives and Itching    Fever, chills   I reviewed her past medical history, social history, family history, and environmental history and no significant changes have been reported from her previous visit.  Review of Systems  Constitutional: Negative for appetite change, chills, fever and unexpected weight change.  HENT: Negative  for congestion, rhinorrhea and sneezing.   Eyes: Negative for itching.  Respiratory: Negative for cough, chest tightness, shortness of breath and wheezing.   Cardiovascular: Negative for chest pain.  Gastrointestinal: Negative for abdominal pain.  Genitourinary: Negative for difficulty urinating.  Skin: Negative for rash.  Allergic/Immunologic: Positive for environmental allergies and food allergies.  Neurological: Positive for headaches.   Objective: There were no vitals taken for this visit. There is no height or weight on file to calculate BMI. Physical Exam  Constitutional: She is oriented to person, place, and time. She appears well-developed and well-nourished.  HENT:  Head: Normocephalic and atraumatic.  Right Ear: External ear normal.  Left Ear: External  ear normal.  Nose: Mucosal edema (slight) present.  Mouth/Throat: Oropharynx is clear and moist.  Eyes: Conjunctivae and EOM are normal.  Cardiovascular: Normal rate, regular rhythm and normal heart sounds. Exam reveals no gallop and no friction rub.  No murmur heard. Pulmonary/Chest: Effort normal and breath sounds normal. She has no wheezes. She has no rales.  Abdominal: Soft.  Musculoskeletal:     Cervical back: Neck supple.  Neurological: She is alert and oriented to person, place, and time.  Skin: Skin is warm. No rash noted.  Multiple tattoos on back and arms.   Psychiatric: She has a normal mood and affect. Her behavior is normal.  Nursing note and vitals reviewed.  Previous notes and tests were reviewed. The plan was reviewed with the patient/family, and all questions/concerned were addressed.  It was my pleasure to see Nijae today and participate in her care. Please feel free to contact me with any questions or concerns.  Sincerely,  Wyline Mood, DO Allergy & Immunology  [facility] office: [phone] [facility] office: [phone] [place] office:  [phone]

## 2019-04-08 NOTE — Assessment & Plan Note (Signed)
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  Patient used to have EpiPen but no longer has one. Interim history - 2020 bloodwork negative to cinnamon and nutmeg.  Avoid cinnamon and nutmeg.   For mild symptoms you can take over the counter antihistamines such as Benadryl and monitor symptoms closely. If symptoms worsen or if you have severe symptoms including breathing issues, throat closure, significant swelling, whole body hives, severe diarrhea and vomiting, lightheadedness then inject epinephrine and seek immediate medical care afterwards.  Food action plan in place.   If interested we can schedule food challenges to cinnamon and nutmeg. Must be done on separate occasions. You must be off antihistamines for 3-5 days before. Plan on being in the office for 2-3 hours and must bring in the food you want to do the oral challenge for. You must call to scheduled an appointment and specify it's for a food challenge.

## 2019-04-08 NOTE — Assessment & Plan Note (Signed)
   See assessment and plan as above allergic rhinitis. 

## 2019-04-08 NOTE — Patient Instructions (Addendum)
Asthma:   Daily controller medication(s):continue Breo 100 1 puff daily and rinse mouth afterwards.   Prior to physical activity:May use albuterol rescue inhaler 2 puffs 5 to 15 minutes prior to strenuous physical activities.  Rescue medications:May use albuterol rescue inhaler 2 puffs or nebulizer every 4 to 6 hours as needed for shortness of breath, chest tightness, coughing, and wheezing. Monitor frequency of use.   Asthma control goals:  Full participation in all desired activities (may need albuterol before activity) Albuterol use two times or less a week on average (not counting use with activity) Cough interfering with sleep two times or less a month Oral steroids no more than once a year No hospitalizations  Environmental allergies.   Read about Brittany Watts oral immunotherapy for dust mites.  I will send into your pharmacy and see if your insurance covers it.  First dose must be taken in the office with 30 minute wait period, then you continue once a day for 3-5 years.   Bloodwork positive to: dust mites.   Continue environmental control measures.   Continue Xhance 2 sprays per nostril twice a day for nasal symptoms.  Nasal saline spray (i.e., Simply Saline) or nasal saline lavage (i.e., NeilMed) is recommended as needed and prior to medicated nasal sprays.  May use over the counter antihistamines such as Zyrtec (cetirizine), Claritin (loratadine), Allegra (fexofenadine), or Xyzal (levocetirizine) daily as needed and may take twice a day for breakthrough symptoms.   Continue Singulair 10mg  daily at night.  May use Patanol 0.1% 1 drop in each eye twice daily as needed for itchy/watery eyes.  ? Do not put over contacts. Wait about 15 minutes after eye drops to put contacts back in.  Allergy with anaphylaxis due to food  Avoid cinnamon and nutmeg.   For mild symptoms you can take over the counter antihistamines such as Benadryl and monitor symptoms closely. If symptoms  worsen or if you have severe symptoms including breathing issues, throat closure, significant swelling, whole body hives, severe diarrhea and vomiting, lightheadedness then inject epinephrine and seek immediate medical care afterwards.  Food action plan in place.   If interested we can schedule food challenges to cinnamon and nutmeg. Must be done on separate occasions. You must be off antihistamines for 3-5 days before. Plan on being in the office for 2-3 hours and must bring in the food you want to do the oral challenge for. You must call to scheduled an appointment and specify it's for a food challenge.   Bee sting allergy  Avoid insect/bee stings.  For mild symptoms you can take over the counter antihistamines such as Benadryl and monitor symptoms closely. If symptoms worsen or if you have severe symptoms including breathing issues, throat closure, significant swelling, whole body hives, severe diarrhea and vomiting, lightheadedness then inject epinephrine and seek immediate medical care afterwards.  History of penicillin allergy  Continue avoidance and consider penicillin testing in future.  Follow up in 4 months or sooner if needed.  Control of House Dust Mite Allergen . Dust mite allergens are a common trigger of allergy and asthma symptoms. While they can be found throughout the house, these microscopic creatures thrive in warm, humid environments such as bedding, upholstered furniture and carpeting. . Because so much time is spent in the bedroom, it is essential to reduce mite levels there.  . Encase pillows, mattresses, and box springs in special allergen-proof fabric covers or airtight, zippered plastic covers.  . Bedding should be washed weekly in  hot water (130 F) and dried in a hot dryer. Allergen-proof covers are available for comforters and pillows that can't be regularly washed.  Brittany Watts the allergy-proof covers every few months. Minimize clutter in the bedroom. Keep pets out  of the bedroom.  Marland Kitchen Keep humidity less than 50% by using a dehumidifier or air conditioning. You can buy a humidity measuring device called a hygrometer to monitor this.  . If possible, replace carpets with hardwood, linoleum, or washable area rugs. If that's not possible, vacuum frequently with a vacuum that has a HEPA filter. . Remove all upholstered furniture and non-washable window drapes from the bedroom. . Remove all non-washable stuffed toys from the bedroom.  Wash stuffed toys weekly.

## 2019-04-08 NOTE — Assessment & Plan Note (Signed)
Past history - Broke out in hives at the age of 24 and no penicillin type antibiotics since then.  Continue avoidance and consider penicillin testing in future.

## 2019-04-11 ENCOUNTER — Telehealth: Payer: Self-pay | Admitting: Allergy

## 2019-04-11 ENCOUNTER — Other Ambulatory Visit: Payer: Self-pay

## 2019-04-11 MED ORDER — MONTELUKAST SODIUM 10 MG PO TABS: 10.0000 mg | ORAL_TABLET | Freq: Every day | ORAL | 5 refills | Status: DC

## 2019-04-11 NOTE — Telephone Encounter (Signed)
Patient is requesting a refill for Singulair. [facility] in [place].

## 2019-04-11 NOTE — Telephone Encounter (Signed)
Sent in refill to [facility]

## 2019-04-13 ENCOUNTER — Telehealth: Payer: Self-pay

## 2019-04-13 NOTE — Telephone Encounter (Signed)
Yes. 30 minute wait after 1st dose. Thank you.

## 2019-04-13 NOTE — Telephone Encounter (Signed)
Pt. Calling to let us know she did get the oral medication for the dmite. Pt. Was stating that she was instructed that she would have to get the first dose in the office. I told the pt. To come in this friday and we can give her the 1st dose and we can have pt. Wait in one of the exam rooms for 30 minutes.

## 2019-04-15 ENCOUNTER — Ambulatory Visit: Payer: BC Managed Care – PPO

## 2019-04-15 ENCOUNTER — Other Ambulatory Visit: Payer: Self-pay

## 2019-04-15 NOTE — Progress Notes (Unsigned)
Immunotherapy   Patient Details  Name: Brittany Watts MRN: [id number] Date of Birth: [birth year]  04/15/2019  [name]  Pt. Came into the office today to start dust mite sublingual tablets. Pt. To take one tablet daily can't have anything to drink 5 minutes after taking it. (dermatophagoides farinae and dermatophagoides pteronyssinus.  Following schedule: n/a Frequency: takes one tablet daily subligual nothing to drink for 5 minutes. Epi-Pen:Epi-Pen Available  Consent signed and patient instructions given.   Murray Hodgkins 04/15/2019, 2:14 PM

## 2019-04-20 ENCOUNTER — Telehealth: Payer: Self-pay | Admitting: Allergy

## 2019-04-20 NOTE — Telephone Encounter (Signed)
The hives start about an hour after taking the [place] No changes to anything else. She is going to email me the pictures and will stop the [place]

## 2019-04-20 NOTE — Telephone Encounter (Signed)
I did over book you, bu patient is off work fridays. If you still want me to change her I can call and ask.

## 2019-04-20 NOTE — Telephone Encounter (Signed)
PT call thinks she is having an allergic reaction. She took the [place] table on friday in office. Took it saturday, forgot sunday, Took it monday, tuesday and today. Neck, arm, and bellybutton have itchy hives that started on Monday. Pt using hydrocortine cream and taking benadryl in evening to help with itch. Please call pt at work ph [phone]

## 2019-04-20 NOTE — Telephone Encounter (Signed)
Please call patient.  Can you ask her when the hives are coming on? Is it right after she takes the pill?  Any other changes in meds, diet, personal care products?  At this time, I recommend that she stops the [place] Take a zyrec 10mg  1-2 times a day to help calm the rash/hives. Take picture of the rash.   Will need to discuss whether to restart or not. She can schedule televisit for that.  Thank you.

## 2019-04-20 NOTE — Telephone Encounter (Signed)
Spoke with pt and she is still taking the [place] She is having random hives but it started on neck and chest, then moved to belly button and arms. None on legs. Took benadryl last night and it helped with the itching and used the hydrocortisone to help also. As of right now she is not itching except randomly and her neck has hives and belly button is red, some hives on chest also. Informed her to take benadryl and use the cream and we would call her back with dr kims recommendations.

## 2019-04-20 NOTE — Telephone Encounter (Signed)
Pt has been scheduled for tomorrow 04-21-2019 at 415 for a televisit

## 2019-04-20 NOTE — Telephone Encounter (Signed)
Can you reschedule that televisit to tomorrow for my [facility] schedule? My Friday schedule looks very full.  Thank you!

## 2019-04-20 NOTE — Telephone Encounter (Signed)
Can you check if she can do a 4:15PM tomorrow televisit? If not, then leave on Friday schedule.  Thank you.

## 2019-04-21 ENCOUNTER — Other Ambulatory Visit: Payer: Self-pay

## 2019-04-21 ENCOUNTER — Ambulatory Visit (INDEPENDENT_AMBULATORY_CARE_PROVIDER_SITE_OTHER): Payer: BC Managed Care – PPO | Admitting: Allergy

## 2019-04-21 ENCOUNTER — Encounter: Payer: Self-pay | Admitting: Allergy

## 2019-04-21 DIAGNOSIS — J4540 Moderate persistent asthma, uncomplicated: Secondary | ICD-10-CM

## 2019-04-21 DIAGNOSIS — H1013 Acute atopic conjunctivitis, bilateral: Secondary | ICD-10-CM

## 2019-04-21 DIAGNOSIS — T7800XD Anaphylactic reaction due to unspecified food, subsequent encounter: Secondary | ICD-10-CM

## 2019-04-21 DIAGNOSIS — T50905D Adverse effect of unspecified drugs, medicaments and biological substances, subsequent encounter: Secondary | ICD-10-CM | POA: Diagnosis not present

## 2019-04-21 DIAGNOSIS — J3089 Other allergic rhinitis: Secondary | ICD-10-CM | POA: Diagnosis not present

## 2019-04-21 DIAGNOSIS — Z91030 Bee allergy status: Secondary | ICD-10-CM

## 2019-04-21 DIAGNOSIS — T50905A Adverse effect of unspecified drugs, medicaments and biological substances, initial encounter: Secondary | ICD-10-CM | POA: Insufficient documentation

## 2019-04-21 DIAGNOSIS — Z880 Allergy status to penicillin: Secondary | ICD-10-CM

## 2019-04-21 MED ORDER — TRIAMCINOLONE ACETONIDE 0.1 % EX CREA: 1.0000 "application " | TOPICAL_CREAM | Freq: Two times a day (BID) | CUTANEOUS | 1 refills | Status: DC | PRN

## 2019-04-21 NOTE — Assessment & Plan Note (Signed)
Pruritic rash after took 3rd dose of Odactra. Slowly improving. Denies any flare in her asthma.  Please email me pictures of the rash.   If symptoms do not improve let me know as we may need a short burst of prednisone.   Stop the Isaiah Serge for now.  May use triamcinolone 0.1% cream twice a day on the rash. Do not use on the face, neck, armpits or groin area. Do not use more than 3 weeks in a row.   Increase Xyzal to twice a day.  Schedule for Odactra drug challenge on a Friday morning in [facility].

## 2019-04-21 NOTE — Assessment & Plan Note (Signed)
Past history - Patient was diagnosed with asthma over 10 years ago. Triggers are rain, allergies and pet exposure.  The past year she had at least 3 courses of prednisone due to asthma exacerbations. 2020 spirometry was normal with no improvement in FEV1 post bronchodilator treatment. Interim history - no flare with Odactra.  Daily controller medication(s):continue Breo 100 1 puff daily and rinse mouth afterwards.   Prior to physical activity:May use albuterol rescue inhaler 2 puffs 5 to 15 minutes prior to strenuous physical activities.  Rescue medications:May use albuterol rescue inhaler 2 puffs or nebulizer every 4 to 6 hours as needed for shortness of breath, chest tightness, coughing, and wheezing. Monitor frequency of use.

## 2019-04-21 NOTE — Assessment & Plan Note (Signed)
   See assessment and plan as above allergic rhinitis. 

## 2019-04-21 NOTE — Patient Instructions (Addendum)
Please email me pictures of the rash.  If symptoms do not improve let me know as we may need a short burst of prednisone.   Perennial allergic rhino conjunctivitis  Stop the Odactra for now.  May use triamcinolone 0.1% cream twice a day on the rash. Do not use on the face, neck, armpits or groin area. Do not use more than 3 weeks in a row.   Increase Xyzal to twice a day.  Continue environmental control measures.   Continue Xhance 2 sprays per nostril twice a day for nasal symptoms.  Nasal saline spray (i.e., Simply Saline) or nasal saline lavage (i.e., NeilMed) is recommended as needed and prior to medicated nasal sprays.  Continue Singulair 10mg  daily at night.  May use Patanol 0.1% 1 drop in each eye twice daily as needed for itchy/watery eyes.  ? Do not put over contacts. Wait about 15 minutes after eye drops to put contacts back in.  Schedule for Odactra drug challenge on a Friday morning in [facility].  Make sure you bring the medications with you. Plan on being here for 1-2 hours.  Sincerely,  [facility], DO Allergy & Immunology  [facility] office: [phone] [facility] office: [phone] [place] office: [phone]

## 2019-04-21 NOTE — Progress Notes (Signed)
RE: Brittany Watts MRN: [id number] DOB: Jun 04, 1985 Date of Telemedicine Visit: 04/21/2019  Referring provider: Bosie Clos, MD Primary care provider: Bosie Clos, MD  Chief Complaint: Allergic Reaction (Televisit in the car. Patient gave verbal consent to treat and bill insurance.)   Telemedicine Follow Up Visit via Telephone: I connected with Brittany Watts for a follow up on 04/21/19 by telephone and verified that I am speaking with the correct person using two identifiers.   I discussed the limitations, risks, security and privacy concerns of performing an evaluation and management service by telephone and the availability of in person appointments. I also discussed with the patient that there may be a patient responsible charge related to this service. The patient expressed understanding and agreed to proceed.  Patient is at home/work. Provider is at the office.  Visit start time: 4:05PM Visit end time: 4:24PM Insurance consent/check in by: front desk Medical consent and medical assistant/nurse: Vonzell Schlatter.  History of Present Illness: She is a 34 y.o. female, who is being followed for asthma, allergic rhino conjunctivitis, food allergies, hymenoptera sting reactions and h/o penicillin allergy. Her previous allergy office visit was on 04/08/2019 with Dr. Selena Batten. Today is a new complaint visit of reactions to [place]  Perennial allergic rhino conjunctivitis Started Odactra sublingual on 04/15/19. Monitored in the office for 30 minutes with no issues.  She took her dose on Saturday but forgot to take medicine on Sunday a she went out of town. She took her next dose on Monday and after a few hours she noted whole body pruritus with some red, prickly rash.   Took benadryl with some benefit and increased Xyzal to 10mg .  No associated swelling but her throat felt weird. No flare in her asthma.  Normally takes Singulair and Xyzal daily.  Denies any changes in diet/medications.    She would really like to continue with the oral Odactra as she was intolerant of the injections.   Assessment and Plan: Brittany Watts is a 34 y.o. female with: Drug reaction Pruritic rash after took 3rd dose of Odactra. Slowly improving. Denies any flare in her asthma.  Please email me pictures of the rash.   If symptoms do not improve let me know as we may need a short burst of prednisone.   Stop the 07-11-1987 for now.  May use triamcinolone 0.1% cream twice a day on the rash. Do not use on the face, neck, armpits or groin area. Do not use more than 3 weeks in a row.   Increase Xyzal to twice a day.  Schedule for Odactra drug challenge on a Friday morning in [facility].  Perennial allergic rhinitis Past history - Perennial rhinoconjunctivitis symptoms for at least 15 years with worsening with weather change.  Tried Xyzal, Singulair, Dymista, Flonase and Nasacort with some benefit.  Skin testing in 2011 was positive to grass, weed, trees, dust mites, cat, dog. 2013 testing was positive to cat, grass, dust mite, trees, ragweed, weed, cockroach, mold, horse, feather. Patient was on allergy injection for a few years but stopped due to localized reactions. 2020 bloodwork only positive to dust mites. Interim history -  Pruritic rash after taking Odactra. Patient would like to continue if possible.   Stop the 2012 for now.  May use triamcinolone 0.1% cream twice a day on the rash. Do not use on the face, neck, armpits or groin area. Do not use more than 3 weeks in a row.   Increase Xyzal to twice  a day.  Continue environmental control measures.   Continue Xhance 2 sprays per nostril twice a day for nasal symptoms.  Nasal saline spray (i.e., Simply Saline) or nasal saline lavage (i.e., NeilMed) is recommended as needed and prior to medicated nasal sprays.  Continue Singulair 10mg  daily at night.  May use Patanol 0.1% 1 drop in each eye twice daily as needed for itchy/watery eyes.   ? Do not put over contacts. Wait about 15 minutes after eye drops to put contacts back in.  Allergic conjunctivitis of both eyes  See assessment and plan as above allergic rhinitis.  Moderate persistent asthma without complication Past history - Patient was diagnosed with asthma over 10 years ago. Triggers are rain, allergies and pet exposure.  The past year she had at least 3 courses of prednisone due to asthma exacerbations. 2020 spirometry was normal with no improvement in FEV1 post bronchodilator treatment. Interim history - no flare with Odactra.  Daily controller medication(s):continue Breo 100 1 puff daily and rinse mouth afterwards.   Prior to physical activity:May use albuterol rescue inhaler 2 puffs 5 to 15 minutes prior to strenuous physical activities.  Rescue medications:May use albuterol rescue inhaler 2 puffs or nebulizer every 4 to 6 hours as needed for shortness of breath, chest tightness, coughing, and wheezing. Monitor frequency of use.   Return for Drug challenge.  Meds ordered this encounter  Medications  . triamcinolone cream (KENALOG) 0.1 %    Sig: Apply 1 application topically 2 (two) times daily as needed. Do not use on the face, neck, armpits or groin area. Do not use more than 3 weeks in a row.    Dispense:  45 g    Refill:  1   Diagnostics: None.  Medication List:  Current Outpatient Medications  Medication Sig Dispense Refill  . albuterol (PROVENTIL HFA;VENTOLIN HFA) 108 (90 BASE) MCG/ACT inhaler Inhale 2 puffs into the lungs every 6 (six) hours as needed. Shortness of breath and wheezing     . albuterol (PROVENTIL) (2.5 MG/3ML) 0.083% nebulizer solution albuterol sulfate 2.5 mg/3 mL (0.083 %) solution for nebulization    . ALPRAZolam (XANAX) 0.5 MG tablet Take 0.5 mg by mouth 3 (three) times daily.    . APPLE CIDER VINEGAR PO Take by mouth.    . benzonatate (TESSALON) 200 MG capsule Take 400 mg by mouth every 8 (eight) hours as needed.    Marland Kitchen  BIOTIN 5000 PO Take 1 tablet by mouth daily.      . Butalbital-APAP-Caffeine 50-325-40 MG capsule butalbital-acetaminophen-caffeine 50 mg-325 mg-40 mg capsule  TAKE ONE CAPSULE BY MOUTH EVERY 6 HOURS AS NEEDED FOR HEADACHE    . COLLAGEN PO Take by mouth.    . Dust Mite Mixed Allergen Ext (ODACTRA) 12 SQ-HDM SUBL Place 1 tablet under the tongue daily. FIRST DOSE MUST BE TAKEN AT ALLERGIST'S OFFICE. Don't eat or drink for 5 minutes afterwards. 30 tablet 5  . EPINEPHrine 0.3 mg/0.3 mL IJ SOAJ injection Inject 0.3 mLs (0.3 mg total) into the muscle as needed for anaphylaxis. 1 each 3  . escitalopram (LEXAPRO) 10 MG tablet Take 10 mg by mouth daily.    . fluticasone furoate-vilanterol (BREO ELLIPTA) 100-25 MCG/INH AEPB Inhale 1 puff into the lungs daily. 28 each 5  . Fluticasone Propionate (XHANCE) 93 MCG/ACT EXHU Place 2 sprays into the nose 2 (two) times daily. 32 mL 5  . levocetirizine (XYZAL) 5 MG tablet Take 1 tablet (5 mg total) by mouth daily. 30 tablet  5  . Linoleic Acid-Sunflower Oil (CLA PO) Take by mouth.    . meclizine (ANTIVERT) 12.5 MG tablet TAKE ONE TABLET BY MOUTH THREE TIMES A DAY FOR 20 DAYS    . medroxyPROGESTERone (DEPO-PROVERA) 150 MG/ML injection Inject 150 mg into the muscle every 3 (three) months.      . montelukast (SINGULAIR) 10 MG tablet Take 1 tablet (10 mg total) by mouth daily. 30 tablet 5  . Multiple Vitamin (MULTIVITAMIN PO) Take by mouth.    Marland Kitchen olopatadine (PATANOL) 0.1 % ophthalmic solution Place 1 drop into both eyes 2 (two) times daily as needed for allergies (itchy/watery eyes). 5 mL 5  . ondansetron (ZOFRAN-ODT) 8 MG disintegrating tablet Take 8 mg by mouth 3 (three) times daily as needed.    Marland Kitchen scopolamine (TRANSDERM-SCOP, 1.5 MG,) 1 MG/3DAYS Transderm-Scop 1.5 mg transdermal patch (1 mg over 3 days)  UNWRAP AND APPLY 1 PATCH TO SKIN EVERY THIRD DAY    . tolterodine (DETROL LA) 4 MG 24 hr capsule Take 4 mg by mouth daily.    Marland Kitchen topiramate (TOPAMAX) 25 MG capsule  Take 25 mg by mouth at bedtime.    . triamcinolone cream (KENALOG) 0.1 % Apply 1 application topically 2 (two) times daily as needed. Do not use on the face, neck, armpits or groin area. Do not use more than 3 weeks in a row. 45 g 1   No current facility-administered medications for this visit.   Allergies: Allergies  Allergen Reactions  . Cinnamon Anaphylaxis  . Nutmeg Oil (Myristica Oil) Anaphylaxis  . Doxycycline Nausea And Vomiting  . Penicillins Hives and Itching    Fever, chills   I reviewed her past medical history, social history, family history, and environmental history and no significant changes have been reported from her previous visit.  Review of Systems  Constitutional: Negative for appetite change, chills, fever and unexpected weight change.  HENT: Negative for congestion, rhinorrhea and sneezing.   Eyes: Negative for itching.  Respiratory: Negative for cough, chest tightness, shortness of breath and wheezing.   Cardiovascular: Negative for chest pain.  Gastrointestinal: Negative for abdominal pain.  Genitourinary: Negative for difficulty urinating.  Skin: Positive for rash.  Allergic/Immunologic: Positive for environmental allergies and food allergies.   Objective: Physical Exam Not obtained as encounter was done via telephone.   Previous notes and tests were reviewed.  I discussed the assessment and treatment plan with the patient. The patient was provided an opportunity to ask questions and all were answered. The patient agreed with the plan and demonstrated an understanding of the instructions. After visit summary/patient instructions available via e-mail.   The patient was advised to call back or seek an in-person evaluation if the symptoms worsen or if the condition fails to improve as anticipated.  I provided 19 minutes of non-face-to-face time during this encounter.  It was my pleasure to participate in Plover Aubert's care today. Please feel free to  contact me with any questions or concerns.   Sincerely,  Wyline Mood, DO Allergy & Immunology  [facility] office: [phone] [facility] office: [phone] [place] office: [phone]

## 2019-04-21 NOTE — Assessment & Plan Note (Signed)
Past history - Perennial rhinoconjunctivitis symptoms for at least 15 years with worsening with weather change.  Tried Xyzal, Singulair, Dymista, Flonase and Nasacort with some benefit.  Skin testing in 2011 was positive to grass, weed, trees, dust mites, cat, dog. 2013 testing was positive to cat, grass, dust mite, trees, ragweed, weed, cockroach, mold, horse, feather. Patient was on allergy injection for a few years but stopped due to localized reactions. 2020 bloodwork only positive to dust mites. Interim history -  Pruritic rash after taking Odactra. Patient would like to continue if possible.   Stop the Isaiah Serge for now.  May use triamcinolone 0.1% cream twice a day on the rash. Do not use on the face, neck, armpits or groin area. Do not use more than 3 weeks in a row.   Increase Xyzal to twice a day.  Continue environmental control measures.   Continue Xhance 2 sprays per nostril twice a day for nasal symptoms.  Nasal saline spray (i.e., Simply Saline) or nasal saline lavage (i.e., NeilMed) is recommended as needed and prior to medicated nasal sprays.  Continue Singulair 10mg  daily at night.  May use Patanol 0.1% 1 drop in each eye twice daily as needed for itchy/watery eyes.  ? Do not put over contacts. Wait about 15 minutes after eye drops to put contacts back in.

## 2019-04-22 ENCOUNTER — Ambulatory Visit: Payer: Self-pay | Admitting: Allergy

## 2019-05-26 NOTE — Progress Notes (Deleted)
Follow Up Note  RE: IVA MONTELONGO MRN: [id number] DOB: July 15, 1985 Date of Office Visit: 05/27/2019  Referring provider: Garlan Fillers, MD Primary care provider: Garlan Fillers, MD  Chief Complaint: No chief complaint on file.  Assessment and Plan: Brittany Watts is a 34 y.o. female with: No problem-specific Assessment & Plan notes found for this encounter.  No follow-ups on file.  Plan: Challenge drug: odactra Challenge as per protocol: {Blank single:19197::"Passed","Failed"} Total time: ***  For next 24 hours monitor for hives, swelling, shortness of breath and dizziness. If you see these symptoms, use Benadryl for mild symptoms and epinephrine for more severe symptoms and call 911.  If no adverse symptoms in the next 24 hours then will take off drug from allergy list.  History of Present Illness: I had the pleasure of seeing Brittany Watts for a follow up visit at the [facility] [place] on 05/26/2019. She is a 34 y.o. female, who is being followed for drug reaction, allergic rhinoconjunctivitis, asthma, h/o penicillin allergy, food allergies, reactions to bee stings. Today she is here for [place] drug challenge. Her previous allergy office visit was on 04/21/2019 with Dr. Maudie Mercury via telemedicine.   History of Reaction: Drug reaction Pruritic rash after took 3rd dose of Odactra. Slowly improving. Denies any flare in her asthma.  Please email me pictures of the rash.   If symptoms do not improve let me know as we may need a short burst of prednisone.   Stop the Kathlene November for now.  May use triamcinolone 0.1% cream twice a day on the rash. Do not use on the face, neck, armpits or groin area. Do not use more than 3 weeks in a row.   Increase Xyzal to twice a day.  Schedule for Odactra drug challenge on a Friday morning in [facility].  Perennial allergic rhinitis Past history - Perennial rhinoconjunctivitis symptoms for at least 15 years with worsening with  weather change.  Tried Xyzal, Singulair, Dymista, Flonase and Nasacort with some benefit.  Skin testing in 2011 was positive to grass, weed, trees, dust mites, cat, dog. 2013 testing was positive to cat, grass, dust mite, trees, ragweed, weed, cockroach, mold, horse, feather. Patient was on allergy injection for a few years but stopped due to localized reactions. 2020 bloodwork only positive to dust mites. Interim history -  Pruritic rash after taking Odactra. Patient would like to continue if possible.   Stop the Kathlene November for now.  May use triamcinolone 0.1% cream twice a day on the rash. Do not use on the face, neck, armpits or groin area. Do not use more than 3 weeks in a row.   Increase Xyzal to twice a day.  Continue environmental control measures.   Continue Xhance 2 sprays per nostril twice a day for nasal symptoms.  Nasal saline spray (i.e., Simply Saline) or nasal saline lavage (i.e., NeilMed) is recommended as needed and prior to medicated nasal sprays.  Continue Singulair 10mg  daily at night.  May use Patanol 0.1% 1 drop in each eye twice daily as needed for itchy/watery eyes.  ? Do not put over contacts. Wait about 15 minutes after eye drops to put contacts back in.  Allergic conjunctivitis of both eyes  See assessment and plan as above allergic rhinitis.  Moderate persistent asthma without complication Past history - Patient was diagnosed with asthma over 10 years ago. Triggers are rain, allergies and pet exposure.  The past year she had at least 3 courses  of prednisone due to asthma exacerbations. 2020 spirometry was normal with no improvement in FEV1 post bronchodilator treatment. Interim history - no flare with Odactra.  Daily controller medication(s):continue Breo 100 1 puff daily and rinse mouth afterwards.   Prior to physical activity:May use albuterol rescue inhaler 2 puffs 5 to 15 minutes prior to strenuous physical activities.  Rescue medications:May use  albuterol rescue inhaler 2 puffs or nebulizer every 4 to 6 hours as needed for shortness of breath, chest tightness, coughing, and wheezing. Monitor frequency of use.  Return for Drug challenge.  Interval History: Patient has not been ill, she has not had any accidental exposures to the culprit medication.   Recent/Current History: Pulmonary disease: {Blank single:19197::"yes","no"} Cardiac disease: {Blank single:19197::"yes","no"} Respiratory infection: {Blank single:19197::"yes","no"} Rash: {Blank single:19197::"yes","no"} Itch: {Blank single:19197::"yes","no"} Swelling: {Blank single:19197::"yes","no"} Cough: {Blank single:19197::"yes","no"} Shortness of breath: {Blank single:19197::"yes","no"} Runny/stuffy nose: {Blank single:19197::"yes","no"} Itchy eyes: {Blank single:19197::"yes","no"} Beta-blocker use: {Blank single:19197::"yes","no"}  Patient/guardian was informed of the test procedure with verbalized understanding of the risk of anaphylaxis.   Last antihistamine use: *** Last beta-blocker use: ***  Medication List:  Current Outpatient Medications  Medication Sig Dispense Refill  . albuterol (PROVENTIL HFA;VENTOLIN HFA) 108 (90 BASE) MCG/ACT inhaler Inhale 2 puffs into the lungs every 6 (six) hours as needed. Shortness of breath and wheezing     . albuterol (PROVENTIL) (2.5 MG/3ML) 0.083% nebulizer solution albuterol sulfate 2.5 mg/3 mL (0.083 %) solution for nebulization    . ALPRAZolam (XANAX) 0.5 MG tablet Take 0.5 mg by mouth 3 (three) times daily.    . APPLE CIDER VINEGAR PO Take by mouth.    . benzonatate (TESSALON) 200 MG capsule Take 400 mg by mouth every 8 (eight) hours as needed.    Marland Kitchen BIOTIN 5000 PO Take 1 tablet by mouth daily.      . Butalbital-APAP-Caffeine 50-325-40 MG capsule butalbital-acetaminophen-caffeine 50 mg-325 mg-40 mg capsule  TAKE ONE CAPSULE BY MOUTH EVERY 6 HOURS AS NEEDED FOR HEADACHE    . COLLAGEN PO Take by mouth.    . Dust Mite Mixed  Allergen Ext (ODACTRA) 12 SQ-HDM SUBL Place 1 tablet under the tongue daily. FIRST DOSE MUST BE TAKEN AT ALLERGIST'S OFFICE. Don't eat or drink for 5 minutes afterwards. 30 tablet 5  . EPINEPHrine 0.3 mg/0.3 mL IJ SOAJ injection Inject 0.3 mLs (0.3 mg total) into the muscle as needed for anaphylaxis. 1 each 3  . escitalopram (LEXAPRO) 10 MG tablet Take 10 mg by mouth daily.    . fluticasone furoate-vilanterol (BREO ELLIPTA) 100-25 MCG/INH AEPB Inhale 1 puff into the lungs daily. 28 each 5  . Fluticasone Propionate (XHANCE) 93 MCG/ACT EXHU Place 2 sprays into the nose 2 (two) times daily. 32 mL 5  . levocetirizine (XYZAL) 5 MG tablet Take 1 tablet (5 mg total) by mouth daily. 30 tablet 5  . Linoleic Acid-Sunflower Oil (CLA PO) Take by mouth.    . meclizine (ANTIVERT) 12.5 MG tablet TAKE ONE TABLET BY MOUTH THREE TIMES A DAY FOR 20 DAYS    . medroxyPROGESTERone (DEPO-PROVERA) 150 MG/ML injection Inject 150 mg into the muscle every 3 (three) months.      . montelukast (SINGULAIR) 10 MG tablet Take 1 tablet (10 mg total) by mouth daily. 30 tablet 5  . Multiple Vitamin (MULTIVITAMIN PO) Take by mouth.    Marland Kitchen olopatadine (PATANOL) 0.1 % ophthalmic solution Place 1 drop into both eyes 2 (two) times daily as needed for allergies (itchy/watery eyes). 5 mL 5  .  ondansetron (ZOFRAN-ODT) 8 MG disintegrating tablet Take 8 mg by mouth 3 (three) times daily as needed.    Marland Kitchen scopolamine (TRANSDERM-SCOP, 1.5 MG,) 1 MG/3DAYS Transderm-Scop 1.5 mg transdermal patch (1 mg over 3 days)  UNWRAP AND APPLY 1 PATCH TO SKIN EVERY THIRD DAY    . tolterodine (DETROL LA) 4 MG 24 hr capsule Take 4 mg by mouth daily.    Marland Kitchen topiramate (TOPAMAX) 25 MG capsule Take 25 mg by mouth at bedtime.    . triamcinolone cream (KENALOG) 0.1 % Apply 1 application topically 2 (two) times daily as needed. Do not use on the face, neck, armpits or groin area. Do not use more than 3 weeks in a row. 45 g 1   No current facility-administered medications  for this visit.   Allergies: Allergies  Allergen Reactions  . Cinnamon Anaphylaxis  . Nutmeg Oil (Myristica Oil) Anaphylaxis  . Doxycycline Nausea And Vomiting  . Penicillins Hives and Itching    Fever, chills   I reviewed her past medical history, social history, family history, and environmental history and no significant changes have been reported from her previous visit.  Review of Systems  Constitutional: Negative for appetite change, chills, fever and unexpected weight change.  HENT: Negative for congestion, rhinorrhea and sneezing.   Eyes: Negative for itching.  Respiratory: Negative for cough, chest tightness, shortness of breath and wheezing.   Cardiovascular: Negative for chest pain.  Gastrointestinal: Negative for abdominal pain.  Genitourinary: Negative for difficulty urinating.  Skin: Positive for rash.  Allergic/Immunologic: Positive for environmental allergies and food allergies.   Objective: There were no vitals taken for this visit. There is no height or weight on file to calculate BMI. Physical Exam  Constitutional: She is oriented to person, place, and time. She appears well-developed and well-nourished.  HENT:  Head: Normocephalic and atraumatic.  Right Ear: External ear normal.  Left Ear: External ear normal.  Nose: Mucosal edema (slight) present.  Mouth/Throat: Oropharynx is clear and moist.  Eyes: Conjunctivae and EOM are normal.  Cardiovascular: Normal rate, regular rhythm and normal heart sounds. Exam reveals no gallop and no friction rub.  No murmur heard. Pulmonary/Chest: Effort normal and breath sounds normal. She has no wheezes. She has no rales.  Abdominal: Soft.  Musculoskeletal:     Cervical back: Neck supple.  Neurological: She is alert and oriented to person, place, and time.  Skin: Skin is warm. No rash noted.  Multiple tattoos on back and arms.   Psychiatric: She has a normal mood and affect. Her behavior is normal.  Nursing note and  vitals reviewed.   Diagnostics: Spirometry:  Tracings reviewed. Her effort: {Blank single:19197::"Good reproducible efforts.","It was hard to get consistent efforts and there is a question as to whether this reflects a maximal maneuver.","Poor effort, data can not be interpreted."} FVC: ***L FEV1: ***L, ***% predicted FEV1/FVC ratio: ***% Interpretation: {Blank single:19197::"Spirometry consistent with mild obstructive disease","Spirometry consistent with moderate obstructive disease","Spirometry consistent with severe obstructive disease","Spirometry consistent with possible restrictive disease","Spirometry consistent with mixed obstructive and restrictive disease","Spirometry uninterpretable due to technique","Spirometry consistent with normal pattern","No overt abnormalities noted given today's efforts"}.  Please see scanned spirometry results for details.  Skin Testing: {Blank single:19197::"None","Deferred due to recent antihistamines use"}. Positive test to: ***. Negative test to: ***.  Results discussed with patient/family.   Previous notes and tests were reviewed. The plan was reviewed with the patient/family, and all questions/concerned were addressed.  It was my pleasure to see Brittany Watts today and participate  in her care. Please feel free to contact me with any questions or concerns.  Sincerely,  Wyline Mood, DO Allergy & Immunology  [facility] office: [phone] [facility] office:[phone] [place] office: [phone]

## 2019-05-27 ENCOUNTER — Encounter: Payer: BC Managed Care – PPO | Admitting: Allergy

## 2019-06-09 NOTE — Progress Notes (Deleted)
Follow Up Note  RE: Brittany Watts MRN: [id number] DOB: [birth year] Date of Office Visit: 06/10/2019  Referring provider: Bosie Clos, MD Primary care provider: Bosie Clos, MD  Chief Complaint: No chief complaint on file.  Assessment and Plan: Brittany Watts is a 34 y.o. female with: No problem-specific Assessment & Plan notes found for this encounter.  No follow-ups on file.  Plan: Challenge drug: Odactra Challenge as per protocol: {Blank single:19197::"Passed","Failed"} Total time: ***  For next 24 hours monitor for hives, swelling, shortness of breath and dizziness. If you see these symptoms, use Benadryl for mild symptoms and epinephrine for more severe symptoms and call 911.  If no adverse symptoms in the next 24 hours then will take off drug from allergy list.  History of Present Illness: I had the pleasure of seeing Brittany Watts for a follow up visit at the [facility] on 06/09/2019. She is a 34 y.o. female, who is being followed for adverse drug reaction, allergic rhinoconjunctivitis and asthma. Today she is here for [place] challenge. Her previous allergy office visit was on 04/21/2019 with Dr. Selena Batten via telemedicine.   History of Reaction: Drug reaction Pruritic rash after took 3rd dose of Odactra. Slowly improving. Denies any flare in her asthma.  Please email me pictures of the rash.   If symptoms do not improve let me know as we may need a short burst of prednisone.   Stop the Isaiah Serge for now.  May use triamcinolone 0.1% cream twice a day on the rash. Do not use on the face, neck, armpits or groin area. Do not use more than 3 weeks in a row.   Increase Xyzal to twice a day.  Schedule for Odactra drug challenge on a Friday morning in [facility].  Perennial allergic rhinitis Past history - Perennial rhinoconjunctivitis symptoms for at least 15 years with worsening with weather change.  Tried Xyzal, Singulair, Dymista, Flonase and  Nasacort with some benefit.  Skin testing in 2011 was positive to grass, weed, trees, dust mites, cat, dog. 2013 testing was positive to cat, grass, dust mite, trees, ragweed, weed, cockroach, mold, horse, feather. Patient was on allergy injection for a few years but stopped due to localized reactions. 2020 bloodwork only positive to dust mites. Interim history -  Pruritic rash after taking Odactra. Patient would like to continue if possible.   Stop the Isaiah Serge for now.  May use triamcinolone 0.1% cream twice a day on the rash. Do not use on the face, neck, armpits or groin area. Do not use more than 3 weeks in a row.   Increase Xyzal to twice a day.  Continue environmental control measures.   Continue Xhance 2 sprays per nostril twice a day for nasal symptoms.  Nasal saline spray (i.e., Simply Saline) or nasal saline lavage (i.e., NeilMed) is recommended as needed and prior to medicated nasal sprays.  Continue Singulair 10mg  daily at night.  May use Patanol 0.1% 1 drop in each eye twice daily as needed for itchy/watery eyes.  ? Do not put over contacts. Wait about 15 minutes after eye drops to put contacts back in.  Allergic conjunctivitis of both eyes  See assessment and plan as above allergic rhinitis.  Moderate persistent asthma without complication Past history - Patient was diagnosed with asthma over 10 years ago. Triggers are rain, allergies and pet exposure.  The past year she had at least 3 courses of prednisone due to asthma exacerbations. 2020 spirometry  was normal with no improvement in FEV1 post bronchodilator treatment. Interim history - no flare with Odactra.  Daily controller medication(s):continue Breo 100 1 puff daily and rinse mouth afterwards.   Prior to physical activity:May use albuterol rescue inhaler 2 puffs 5 to 15 minutes prior to strenuous physical activities.  Rescue medications:May use albuterol rescue inhaler 2 puffs or nebulizer every 4 to 6 hours  as needed for shortness of breath, chest tightness, coughing, and wheezing. Monitor frequency of use.  Return for Drug challenge.  Interval History: Patient has not been ill, she has not had any accidental exposures to the culprit medication.   Recent/Current History: Pulmonary disease: {Blank single:19197::"yes","no"} Cardiac disease: {Blank single:19197::"yes","no"} Respiratory infection: {Blank single:19197::"yes","no"} Rash: {Blank single:19197::"yes","no"} Itch: {Blank single:19197::"yes","no"} Swelling: {Blank single:19197::"yes","no"} Cough: {Blank single:19197::"yes","no"} Shortness of breath: {Blank single:19197::"yes","no"} Runny/stuffy nose: {Blank single:19197::"yes","no"} Itchy eyes: {Blank single:19197::"yes","no"} Beta-blocker use: {Blank single:19197::"yes","no"}  Patient/guardian was informed of the test procedure with verbalized understanding of the risk of anaphylaxis.   Last antihistamine use: *** Last beta-blocker use: ***  Medication List:  Current Outpatient Medications  Medication Sig Dispense Refill  . albuterol (PROVENTIL HFA;VENTOLIN HFA) 108 (90 BASE) MCG/ACT inhaler Inhale 2 puffs into the lungs every 6 (six) hours as needed. Shortness of breath and wheezing     . albuterol (PROVENTIL) (2.5 MG/3ML) 0.083% nebulizer solution albuterol sulfate 2.5 mg/3 mL (0.083 %) solution for nebulization    . ALPRAZolam (XANAX) 0.5 MG tablet Take 0.5 mg by mouth 3 (three) times daily.    . APPLE CIDER VINEGAR PO Take by mouth.    . benzonatate (TESSALON) 200 MG capsule Take 400 mg by mouth every 8 (eight) hours as needed.    Marland Kitchen BIOTIN 5000 PO Take 1 tablet by mouth daily.      . Butalbital-APAP-Caffeine 50-325-40 MG capsule butalbital-acetaminophen-caffeine 50 mg-325 mg-40 mg capsule  TAKE ONE CAPSULE BY MOUTH EVERY 6 HOURS AS NEEDED FOR HEADACHE    . COLLAGEN PO Take by mouth.    . Dust Mite Mixed Allergen Ext (ODACTRA) 12 SQ-HDM SUBL Place 1 tablet under the  tongue daily. FIRST DOSE MUST BE TAKEN AT ALLERGIST'S OFFICE. Don't eat or drink for 5 minutes afterwards. 30 tablet 5  . EPINEPHrine 0.3 mg/0.3 mL IJ SOAJ injection Inject 0.3 mLs (0.3 mg total) into the muscle as needed for anaphylaxis. 1 each 3  . escitalopram (LEXAPRO) 10 MG tablet Take 10 mg by mouth daily.    . fluticasone furoate-vilanterol (BREO ELLIPTA) 100-25 MCG/INH AEPB Inhale 1 puff into the lungs daily. 28 each 5  . Fluticasone Propionate (XHANCE) 93 MCG/ACT EXHU Place 2 sprays into the nose 2 (two) times daily. 32 mL 5  . levocetirizine (XYZAL) 5 MG tablet Take 1 tablet (5 mg total) by mouth daily. 30 tablet 5  . Linoleic Acid-Sunflower Oil (CLA PO) Take by mouth.    . meclizine (ANTIVERT) 12.5 MG tablet TAKE ONE TABLET BY MOUTH THREE TIMES A DAY FOR 20 DAYS    . medroxyPROGESTERone (DEPO-PROVERA) 150 MG/ML injection Inject 150 mg into the muscle every 3 (three) months.      . montelukast (SINGULAIR) 10 MG tablet Take 1 tablet (10 mg total) by mouth daily. 30 tablet 5  . Multiple Vitamin (MULTIVITAMIN PO) Take by mouth.    Marland Kitchen olopatadine (PATANOL) 0.1 % ophthalmic solution Place 1 drop into both eyes 2 (two) times daily as needed for allergies (itchy/watery eyes). 5 mL 5  . ondansetron (ZOFRAN-ODT) 8 MG disintegrating tablet Take 8  mg by mouth 3 (three) times daily as needed.    Marland Kitchen scopolamine (TRANSDERM-SCOP, 1.5 MG,) 1 MG/3DAYS Transderm-Scop 1.5 mg transdermal patch (1 mg over 3 days)  UNWRAP AND APPLY 1 PATCH TO SKIN EVERY THIRD DAY    . tolterodine (DETROL LA) 4 MG 24 hr capsule Take 4 mg by mouth daily.    Marland Kitchen topiramate (TOPAMAX) 25 MG capsule Take 25 mg by mouth at bedtime.    . triamcinolone cream (KENALOG) 0.1 % Apply 1 application topically 2 (two) times daily as needed. Do not use on the face, neck, armpits or groin area. Do not use more than 3 weeks in a row. 45 g 1   No current facility-administered medications for this visit.   Allergies: Allergies  Allergen Reactions   . Cinnamon Anaphylaxis  . Nutmeg Oil (Myristica Oil) Anaphylaxis  . Doxycycline Nausea And Vomiting  . Penicillins Hives and Itching    Fever, chills   I reviewed her past medical history, social history, family history, and environmental history and no significant changes have been reported from her previous visit.  Review of Systems  Constitutional: Negative for appetite change, chills, fever and unexpected weight change.  HENT: Negative for congestion, rhinorrhea and sneezing.   Eyes: Negative for itching.  Respiratory: Negative for cough, chest tightness, shortness of breath and wheezing.   Cardiovascular: Negative for chest pain.  Gastrointestinal: Negative for abdominal pain.  Genitourinary: Negative for difficulty urinating.  Skin: Positive for rash.  Allergic/Immunologic: Positive for environmental allergies and food allergies.   Objective: There were no vitals taken for this visit. There is no height or weight on file to calculate BMI. Physical Exam  Constitutional: She is oriented to person, place, and time. She appears well-developed and well-nourished.  HENT:  Head: Normocephalic and atraumatic.  Right Ear: External ear normal.  Left Ear: External ear normal.  Nose: Mucosal edema (slight) present.  Mouth/Throat: Oropharynx is clear and moist.  Eyes: Conjunctivae and EOM are normal.  Cardiovascular: Normal rate, regular rhythm and normal heart sounds. Exam reveals no gallop and no friction rub.  No murmur heard. Pulmonary/Chest: Effort normal and breath sounds normal. She has no wheezes. She has no rales.  Abdominal: Soft.  Musculoskeletal:     Cervical back: Neck supple.  Neurological: She is alert and oriented to person, place, and time.  Skin: Skin is warm. No rash noted.  Multiple tattoos on back and arms.   Psychiatric: She has a normal mood and affect. Her behavior is normal.  Nursing note and vitals reviewed.   Diagnostics: Spirometry:  Tracings  reviewed. Her effort: {Blank single:19197::"Good reproducible efforts.","It was hard to get consistent efforts and there is a question as to whether this reflects a maximal maneuver.","Poor effort, data can not be interpreted."} FVC: ***L FEV1: ***L, ***% predicted FEV1/FVC ratio: ***% Interpretation: {Blank single:19197::"Spirometry consistent with mild obstructive disease","Spirometry consistent with moderate obstructive disease","Spirometry consistent with severe obstructive disease","Spirometry consistent with possible restrictive disease","Spirometry consistent with mixed obstructive and restrictive disease","Spirometry uninterpretable due to technique","Spirometry consistent with normal pattern","No overt abnormalities noted given today's efforts"}.  Please see scanned spirometry results for details.  Skin Testing: {Blank single:19197::"None","Deferred due to recent antihistamines use"}. Positive test to: ***. Negative test to: ***.  Results discussed with patient/family.   Previous notes and tests were reviewed. The plan was reviewed with the patient/family, and all questions/concerned were addressed.  It was my pleasure to see Mahina today and participate in her care. Please feel free to contact  me with any questions or concerns.  Sincerely,  Wyline Mood, DO Allergy & Immunology  [facility] office: [phone] [facility] [phone] [place] office: [phone]

## 2019-06-10 ENCOUNTER — Encounter: Payer: BC Managed Care – PPO | Admitting: Allergy

## 2019-07-08 ENCOUNTER — Encounter: Payer: BC Managed Care – PPO | Admitting: Allergy

## 2019-08-04 NOTE — Progress Notes (Signed)
Follow Up Note  RE: LYNDEE HERBST MRN: [id number] DOB: 1985/12/01 Date of Office Visit: 08/05/2019  Referring provider: Bosie Clos, MD Primary care provider: Bosie Clos, MD  Chief Complaint: [place] Challenge Isaiah Serge)  Assessment and Plan: Emileigh is a 34 y.o. female with: Drug reaction Past history - Pruritic rash after took 3rd dose of Odactra. Denies any flare in her asthma. Interim history -   Patient was given 1/4 tablet with 20 minutes wait and the rest of the tablet was given with 30 minute wait.  She tolerated it well with mild perioral pruritus. Stable vitals. Physical exam unremarkable.   May start taking Odactra daily   Perennial allergic rhinitis Past history - Perennial rhinoconjunctivitis symptoms for at least 15 years with worsening with weather change.  Tried Xyzal, Singulair, Dymista, Flonase and Nasacort with some benefit.  Skin testing in 2011 was positive to grass, weed, trees, dust mites, cat, dog. 2013 testing was positive to cat, grass, dust mite, trees, ragweed, weed, cockroach, mold, horse, feather. Patient was on allergy injection for a few years but stopped due to localized reactions. 2020 bloodwork only positive to dust mites. Interim history -  Increased symptoms. Only using Xhance prn.   Start using [place] daily starting 5/15.  For mild symptoms you can take over the counter antihistamines such as Benadryl and monitor symptoms closely. If symptoms worsen or if you have severe symptoms including breathing issues, throat closure, significant swelling, whole body hives, severe diarrhea and vomiting, lightheadedness then inject epinephrine and seek immediate medical care afterwards.  May use over the counter antihistamines such as Zyrtec (cetirizine), Claritin (loratadine), Allegra (fexofenadine), or Xyzal (levocetirizine) daily as needed.  You may use the ones with decongestant for a few days at a time when you have severe  congestion.  Continue environmental control measures.   Continue Xhance 2 sprays per nostril twice a day for nasal symptoms.  Nasal saline spray (i.e., Simply Saline) or nasal saline lavage (i.e., NeilMed) is recommended as needed and prior to medicated nasal sprays.  Continue Singulair 10mg  daily at night.  May use Patanol 0.1% 1 drop in each eye twice daily as needed for itchy/watery eyes.  ? Do not put over contacts. Wait about 15 minutes after eye drops to put contacts back in.  Allergic conjunctivitis of both eyes  See assessment and plan as above for allergic rhinitis.  Allergy with anaphylaxis due to food, subsequent encounter Past history - Anaphylactic reactions to cinnamon and nutmeg in the past.  Patient used to have EpiPen but no longer has one. 2020 bloodwork negative to cinnamon and nutmeg. Interim history - No reactions.   Avoid cinnamon and nutmeg.   For mild symptoms you can take over the counter antihistamines such as Benadryl and monitor symptoms closely. If symptoms worsen or if you have severe symptoms including breathing issues, throat closure, significant swelling, whole body hives, severe diarrhea and vomiting, lightheadedness then inject epinephrine and seek immediate medical care afterwards.  Food action plan in place.   If interested we can schedule food challenges to cinnamon and nutmeg. Must be done on separate occasions. You must be off antihistamines for 3-5 days before. Plan on being in the office for 2-3 hours and must bring in the food you want to do the oral challenge for. You must call to scheduled an appointment and specify it's for a food challenge.   Bee sting allergy Past history - Reactions as a child with shortness of  breath and lip swelling requiring ER visit.  2020 hymenoptera panel was negative.  Interim history - no stings since last visit.   Avoid insect/bee stings.  For mild symptoms you can take over the counter antihistamines such  as Benadryl and monitor symptoms closely. If symptoms worsen or if you have severe symptoms including breathing issues, throat closure, significant swelling, whole body hives, severe diarrhea and vomiting, lightheadedness then inject epinephrine and seek immediate medical care afterwards.  Moderate persistent asthma without complication Past history - Patient was diagnosed with asthma over 10 years ago. Triggers are rain, allergies and pet exposure.  The past year she had at least 3 courses of prednisone due to asthma exacerbations. 2020 spirometry was normal with no improvement in FEV1 post bronchodilator treatment. Interim history - only using Breo a few days out of the week. Symptoms worse after rain.  Today's spirometry was normal.   Daily controller medication(s):continue Breo 100 1 puff daily and rinse mouth afterwards.   Prior to physical activity:May use albuterol rescue inhaler 2 puffs 5 to 15 minutes prior to strenuous physical activities.  Rescue medications:May use albuterol rescue inhaler 2 puffs or nebulizer every 4 to 6 hours as needed for shortness of breath, chest tightness, coughing, and wheezing. Monitor frequency of use.    History of penicillin allergy Past history - Broke out in hives at the age of 72 and no penicillin type antibiotics since then.  Continue avoidance and consider penicillin testing in future.  Return in about 4 months (around 12/06/2019).  Plan: Challenge drug: [place] as per protocol: Passed Total time: 50 minutes  For next 24 hours monitor for hives, swelling, shortness of breath and dizziness. If you see these symptoms, use Benadryl for mild symptoms and epinephrine for more severe symptoms and call 911.  If no adverse symptoms in the next 24 hours then will take off drug from allergy list.  History of Present Illness: I had the pleasure of seeing Brittany Watts for a follow up visit at the [facility] on  08/05/2019. She is a 34 y.o. female, who is being followed for drug reaction, allergic rhino conjunctivitis, asthma. Today she is here for [place] drug challenge. Her previous allergy office visit was on 04/21/19 with Dr. Maudie Mercury via telemedicine.    History of Reaction: Pruritic rash after took 3rd dose of Odactra. Denies any flare in her asthma. Patient states that she also used a new body oil around this time though.   Allergic rhino conjunctivitis Having issues with nasal congestion, sneezing, rhinorrhea and ear pressure. This has been worsening since the pollen started to come out. Currently on Xyzal once a day and using Xhance as needed. Take Singulair at night. Only using eye drops about once a week.   Moderate persistent asthma  Some chest tightness after raining. Currently on Breo 100 1 puff daily about 3 days out of the week.  Denies any ER/urgent care visits or prednisone use since the last visit.  Interval History: Patient has not been ill, she has not had any accidental exposures to the culprit medication.   Recent/Current History: Pulmonary disease: yes  Asthma  Cardiac disease: no Respiratory infection: no Rash: no Itch: yes  Itchy eyes Swelling: no Cough: yes from PND Shortness of breath: no Runny/stuffy nose: yes Itchy eyes: yes Beta-blocker use: no  Patient/guardian was informed of the test procedure with verbalized understanding of the risk of anaphylaxis.   Last antihistamine use: last night  Medication List:  Current Outpatient Medications  Medication Sig Dispense Refill  . albuterol (PROVENTIL HFA;VENTOLIN HFA) 108 (90 BASE) MCG/ACT inhaler Inhale 2 puffs into the lungs every 6 (six) hours as needed. Shortness of breath and wheezing     . albuterol (PROVENTIL) (2.5 MG/3ML) 0.083% nebulizer solution albuterol sulfate 2.5 mg/3 mL (0.083 %) solution for nebulization    . ALPRAZolam (XANAX) 0.5 MG tablet Take 0.5 mg by mouth 3 (three) times daily.    . APPLE  CIDER VINEGAR PO Take by mouth.    . benzonatate (TESSALON) 200 MG capsule Take 400 mg by mouth every 8 (eight) hours as needed.    Marland Kitchen BIOTIN 5000 PO Take 1 tablet by mouth daily.      . Butalbital-APAP-Caffeine 50-325-40 MG capsule butalbital-acetaminophen-caffeine 50 mg-325 mg-40 mg capsule  TAKE ONE CAPSULE BY MOUTH EVERY 6 HOURS AS NEEDED FOR HEADACHE    . COLLAGEN PO Take by mouth.    . Dust Mite Mixed Allergen Ext (ODACTRA) 12 SQ-HDM SUBL Place 1 tablet under the tongue daily. FIRST DOSE MUST BE TAKEN AT ALLERGIST'S OFFICE. Don't eat or drink for 5 minutes afterwards. 30 tablet 5  . EPINEPHrine 0.3 mg/0.3 mL IJ SOAJ injection Inject 0.3 mLs (0.3 mg total) into the muscle as needed for anaphylaxis. 1 each 3  . escitalopram (LEXAPRO) 10 MG tablet Take 10 mg by mouth daily.    . fluticasone furoate-vilanterol (BREO ELLIPTA) 100-25 MCG/INH AEPB Inhale 1 puff into the lungs daily. 28 each 5  . Fluticasone Propionate (XHANCE) 93 MCG/ACT EXHU Place 2 sprays into the nose 2 (two) times daily. 32 mL 5  . LARIN 24 FE 1-20 MG-MCG(24) tablet Take 1 tablet by mouth daily.    Marland Kitchen levocetirizine (XYZAL) 5 MG tablet Take 1 tablet (5 mg total) by mouth daily. 30 tablet 5  . Linoleic Acid-Sunflower Oil (CLA PO) Take by mouth.    . meclizine (ANTIVERT) 12.5 MG tablet TAKE ONE TABLET BY MOUTH THREE TIMES A DAY FOR 20 DAYS    . montelukast (SINGULAIR) 10 MG tablet Take 1 tablet (10 mg total) by mouth daily. 30 tablet 5  . Multiple Vitamin (MULTIVITAMIN PO) Take by mouth.    Marland Kitchen olopatadine (PATANOL) 0.1 % ophthalmic solution Place 1 drop into both eyes 2 (two) times daily as needed for allergies (itchy/watery eyes). 5 mL 5  . ondansetron (ZOFRAN-ODT) 8 MG disintegrating tablet Take 8 mg by mouth 3 (three) times daily as needed.    Marland Kitchen scopolamine (TRANSDERM-SCOP, 1.5 MG,) 1 MG/3DAYS Transderm-Scop 1.5 mg transdermal patch (1 mg over 3 days)  UNWRAP AND APPLY 1 PATCH TO SKIN EVERY THIRD DAY    . tolterodine (DETROL  LA) 4 MG 24 hr capsule Take 4 mg by mouth daily.    Marland Kitchen triamcinolone cream (KENALOG) 0.1 % Apply 1 application topically 2 (two) times daily as needed. Do not use on the face, neck, armpits or groin area. Do not use more than 3 weeks in a row. 45 g 1   No current facility-administered medications for this visit.   Allergies: Allergies  Allergen Reactions  . Cinnamon Anaphylaxis  . Nutmeg Oil ([place]ristica Oil) Anaphylaxis  . Doxycycline Nausea And Vomiting  . Penicillins Hives and Itching    Fever, chills   I reviewed her past medical history, social history, family history, and environmental history and no significant changes have been reported from her previous visit.  Review of Systems  Constitutional: Negative for appetite change, chills, fever  and unexpected weight change.  HENT: Positive for congestion, postnasal drip, rhinorrhea and sneezing.   Eyes: Positive for itching.  Respiratory: Negative for cough, chest tightness, shortness of breath and wheezing.   Cardiovascular: Negative for chest pain.  Gastrointestinal: Negative for abdominal pain.  Genitourinary: Negative for difficulty urinating.  Skin: Negative for rash.  Allergic/Immunologic: Positive for environmental allergies and food allergies.   Objective: BP 128/64 (BP Location: Right Arm, Patient Position: Sitting, Cuff Size: Normal)   Pulse 68   Temp 98 F (36.7 C) (Oral)   Resp 17   SpO2 100%  There is no height or weight on file to calculate BMI. Physical Exam  Constitutional: She is oriented to person, place, and time. She appears well-developed and well-nourished.  HENT:  Head: Normocephalic and atraumatic.  Right Ear: External ear normal.  Left Ear: External ear normal.  Nose: Mucosal edema (slight) present.  Mouth/Throat: Oropharynx is clear and moist.  Eyes: Conjunctivae and EOM are normal.  Cardiovascular: Normal rate, regular rhythm and normal heart sounds. Exam reveals no gallop and no friction rub.   No murmur heard. Pulmonary/Chest: Effort normal and breath sounds normal. She has no wheezes. She has no rales.  Abdominal: Soft.  Musculoskeletal:     Cervical back: Neck supple.  Neurological: She is alert and oriented to person, place, and time.  Skin: Skin is warm. No rash noted.  Multiple tattoos on back and arms.   Psychiatric: She has a normal mood and affect. Her behavior is normal.  Nursing note and vitals reviewed.  Diagnostics: Spirometry:  Tracings reviewed. Her effort: Good reproducible efforts. FVC: 2.76L FEV1: 2.17L, 82% predicted FEV1/FVC ratio: 79% Interpretation: Spirometry consistent with normal pattern.  Please see scanned spirometry results for details.  Previous notes and tests were reviewed. The plan was reviewed with the patient/family, and all questions/concerned were addressed.  It was my pleasure to see Thi today and participate in her care. Please feel free to contact me with any questions or concerns.  Sincerely,  Wyline Mood, DO Allergy & Immunology  [facility] office: [phone] [place] office:[phone] [place] office: [phone]  50 minutes spent face-to-face with more than 50% of the time spent discussing drug allergies, asthma, allergic rhinitis, food allergies.

## 2019-08-05 ENCOUNTER — Encounter: Payer: Self-pay | Admitting: Allergy

## 2019-08-05 ENCOUNTER — Other Ambulatory Visit: Payer: Self-pay

## 2019-08-05 ENCOUNTER — Ambulatory Visit (INDEPENDENT_AMBULATORY_CARE_PROVIDER_SITE_OTHER): Payer: BC Managed Care – PPO | Admitting: Allergy

## 2019-08-05 VITALS — BP 128/64 | HR 68 | Temp 98.0°F | Resp 17

## 2019-08-05 DIAGNOSIS — H1013 Acute atopic conjunctivitis, bilateral: Secondary | ICD-10-CM | POA: Diagnosis not present

## 2019-08-05 DIAGNOSIS — J4540 Moderate persistent asthma, uncomplicated: Secondary | ICD-10-CM

## 2019-08-05 DIAGNOSIS — J3089 Other allergic rhinitis: Secondary | ICD-10-CM

## 2019-08-05 DIAGNOSIS — T7800XD Anaphylactic reaction due to unspecified food, subsequent encounter: Secondary | ICD-10-CM

## 2019-08-05 DIAGNOSIS — Z880 Allergy status to penicillin: Secondary | ICD-10-CM

## 2019-08-05 DIAGNOSIS — T50905D Adverse effect of unspecified drugs, medicaments and biological substances, subsequent encounter: Secondary | ICD-10-CM

## 2019-08-05 DIAGNOSIS — Z91030 Bee allergy status: Secondary | ICD-10-CM

## 2019-08-05 MED ORDER — BREO ELLIPTA 100-25 MCG/INH IN AEPB: 1.0000 | INHALATION_SPRAY | Freq: Every day | RESPIRATORY_TRACT | 5 refills | Status: DC

## 2019-08-05 NOTE — Assessment & Plan Note (Addendum)
   See assessment and plan as above for allergic rhinitis.  

## 2019-08-05 NOTE — Assessment & Plan Note (Signed)
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  Patient used to have EpiPen but no longer has one. 2020 bloodwork negative to cinnamon and nutmeg. Interim history - No reactions.   Avoid cinnamon and nutmeg.   For mild symptoms you can take over the counter antihistamines such as Benadryl and monitor symptoms closely. If symptoms worsen or if you have severe symptoms including breathing issues, throat closure, significant swelling, whole body hives, severe diarrhea and vomiting, lightheadedness then inject epinephrine and seek immediate medical care afterwards.  Food action plan in place.   If interested we can schedule food challenges to cinnamon and nutmeg. Must be done on separate occasions. You must be off antihistamines for 3-5 days before. Plan on being in the office for 2-3 hours and must bring in the food you want to do the oral challenge for. You must call to scheduled an appointment and specify it's for a food challenge.

## 2019-08-05 NOTE — Assessment & Plan Note (Signed)
Past history - Broke out in hives at the age of 30 and no penicillin type antibiotics since then.  Continue avoidance and consider penicillin testing in future.

## 2019-08-05 NOTE — Assessment & Plan Note (Signed)
Past history - Patient was diagnosed with asthma over 10 years ago. Triggers are rain, allergies and pet exposure.  The past year she had at least 3 courses of prednisone due to asthma exacerbations. 2020 spirometry was normal with no improvement in FEV1 post bronchodilator treatment. Interim history - only using Breo a few days out of the week. Symptoms worse after rain.  Today's spirometry was normal.   Daily controller medication(s):continue Breo 100 1 puff daily and rinse mouth afterwards.   Prior to physical activity:May use albuterol rescue inhaler 2 puffs 5 to 15 minutes prior to strenuous physical activities.  Rescue medications:May use albuterol rescue inhaler 2 puffs or nebulizer every 4 to 6 hours as needed for shortness of breath, chest tightness, coughing, and wheezing. Monitor frequency of use.

## 2019-08-05 NOTE — Patient Instructions (Addendum)
Perennial allergic rhino conjunctivitis  Start using Odactra daily starting 5/15.  For mild symptoms you can take over the counter antihistamines such as Benadryl and monitor symptoms closely. If symptoms worsen or if you have severe symptoms including breathing issues, throat closure, significant swelling, whole body hives, severe diarrhea and vomiting, lightheadedness then inject epinephrine and seek immediate medical care afterwards.  May use over the counter antihistamines such as Zyrtec (cetirizine), Claritin (loratadine), Allegra (fexofenadine), or Xyzal (levocetirizine) daily as needed.  You may use the ones with decongestant for a few days at a time when you have severe congestion.  Continue environmental control measures.   Continue Xhance 2 sprays per nostril twice a day for nasal symptoms.  Nasal saline spray (i.e., Simply Saline) or nasal saline lavage (i.e., NeilMed) is recommended as needed and prior to medicated nasal sprays.  Continue Singulair 10mg  daily at night.  May use Patanol 0.1% 1 drop in each eye twice daily as needed for itchy/watery eyes.  ? Do not put over contacts. Wait about 15 minutes after eye drops to put contacts back in.  Asthma:   Daily controller medication(s):continue Breo 100 1 puff daily and rinse mouth afterwards.   Prior to physical activity:May use albuterol rescue inhaler 2 puffs 5 to 15 minutes prior to strenuous physical activities.  Rescue medications:May use albuterol rescue inhaler 2 puffs or nebulizer every 4 to 6 hours as needed for shortness of breath, chest tightness, coughing, and wheezing. Monitor frequency of use.   Asthma control goals:  Full participation in all desired activities (may need albuterol before activity) Albuterol use two times or less a week on average (not counting use with activity) Cough interfering with sleep two times or less a month Oral steroids no more than once a year No hospitalizations  Allergy  with anaphylaxis due to food  Avoid cinnamon and nutmeg.   For mild symptoms you can take over the counter antihistamines such as Benadryl and monitor symptoms closely. If symptoms worsen or if you have severe symptoms including breathing issues, throat closure, significant swelling, whole body hives, severe diarrhea and vomiting, lightheadedness then inject epinephrine and seek immediate medical care afterwards.  Food action plan in place.   If interested we can schedule food challenges to cinnamon and nutmeg. Must be done on separate occasions. You must be off antihistamines for 3-5 days before. Plan on being in the office for 2-3 hours and must bring in the food you want to do the oral challenge for. You must call to scheduled an appointment and specify it's for a food challenge.   Bee sting allergy  Avoid insect/bee stings.  For mild symptoms you can take over the counter antihistamines such as Benadryl and monitor symptoms closely. If symptoms worsen or if you have severe symptoms including breathing issues, throat closure, significant swelling, whole body hives, severe diarrhea and vomiting, lightheadedness then inject epinephrine and seek immediate medical care afterwards.  History of penicillin allergy  Continue avoidance and consider penicillin testing in future.  Follow up in 4 months or sooner if needed with either Dr. or Nurse Practitioner.   Control of House Dust Mite Allergen . Dust mite allergens are a common trigger of allergy and asthma symptoms. While they can be found throughout the house, these microscopic creatures thrive in warm, humid environments such as bedding, upholstered furniture and carpeting. . Because so much time is spent in the bedroom, it is essential to reduce mite levels there.  . Encase  pillows, mattresses, and box springs in special allergen-proof fabric covers or airtight, zippered plastic covers.  . Bedding should be washed weekly in hot  water (130 F) and dried in a hot dryer. Allergen-proof covers are available for comforters and pillows that can't be regularly washed.  Wendee Copp the allergy-proof covers every few months. Minimize clutter in the bedroom. Keep pets out of the bedroom.  Marland Kitchen Keep humidity less than 50% by using a dehumidifier or air conditioning. You can buy a humidity measuring device called a hygrometer to monitor this.  . If possible, replace carpets with hardwood, linoleum, or washable area rugs. If that's not possible, vacuum frequently with a vacuum that has a HEPA filter. . Remove all upholstered furniture and non-washable window drapes from the bedroom. . Remove all non-washable stuffed toys from the bedroom.  Wash stuffed toys weekly.  Reducing Pollen Exposure . Pollen seasons: trees (spring), grass (summer) and ragweed/weeds (fall). Marland Kitchen Keep windows closed in your home and car to lower pollen exposure.  Susa Simmonds air conditioning in the bedroom and throughout the house if possible.  . Avoid going out in dry windy days - especially early morning. . Pollen counts are highest between 5 - 10 AM and on dry, hot and windy days.  . Save outside activities for late afternoon or after a heavy rain, when pollen levels are lower.  . Avoid mowing of grass if you have grass pollen allergy. Marland Kitchen Be aware that pollen can also be transported indoors on people and pets.  . Dry your clothes in an automatic dryer rather than hanging them outside where they might collect pollen.  . Rinse hair and eyes before bedtime.

## 2019-08-05 NOTE — Assessment & Plan Note (Signed)
Past history - Pruritic rash after took 3rd dose of Odactra. Denies any flare in her asthma. Interim history -   Patient was given 1/4 tablet with 20 minutes wait and the rest of the tablet was given with 30 minute wait.  She tolerated it well with mild perioral pruritus. Stable vitals. Physical exam unremarkable.   May start taking Odactra daily

## 2019-08-05 NOTE — Assessment & Plan Note (Signed)
Past history - Perennial rhinoconjunctivitis symptoms for at least 15 years with worsening with weather change.  Tried Xyzal, Singulair, Dymista, Flonase and Nasacort with some benefit.  Skin testing in 2011 was positive to grass, weed, trees, dust mites, cat, dog. 2013 testing was positive to cat, grass, dust mite, trees, ragweed, weed, cockroach, mold, horse, feather. Patient was on allergy injection for a few years but stopped due to localized reactions. 2020 bloodwork only positive to dust mites. Interim history -  Increased symptoms. Only using Xhance prn.   Start using [place] daily starting 5/15.  For mild symptoms you can take over the counter antihistamines such as Benadryl and monitor symptoms closely. If symptoms worsen or if you have severe symptoms including breathing issues, throat closure, significant swelling, whole body hives, severe diarrhea and vomiting, lightheadedness then inject epinephrine and seek immediate medical care afterwards.  May use over the counter antihistamines such as Zyrtec (cetirizine), Claritin (loratadine), Allegra (fexofenadine), or Xyzal (levocetirizine) daily as needed.  You may use the ones with decongestant for a few days at a time when you have severe congestion.  Continue environmental control measures.   Continue Xhance 2 sprays per nostril twice a day for nasal symptoms.  Nasal saline spray (i.e., Simply Saline) or nasal saline lavage (i.e., NeilMed) is recommended as needed and prior to medicated nasal sprays.  Continue Singulair 10mg  daily at night.  May use Patanol 0.1% 1 drop in each eye twice daily as needed for itchy/watery eyes.  ? Do not put over contacts. Wait about 15 minutes after eye drops to put contacts back in.

## 2019-08-05 NOTE — Assessment & Plan Note (Signed)
Past history - Reactions as a child with shortness of breath and lip swelling requiring ER visit.  2020 hymenoptera panel was negative.  Interim history - no stings since last visit.   Avoid insect/bee stings.  For mild symptoms you can take over the counter antihistamines such as Benadryl and monitor symptoms closely. If symptoms worsen or if you have severe symptoms including breathing issues, throat closure, significant swelling, whole body hives, severe diarrhea and vomiting, lightheadedness then inject epinephrine and seek immediate medical care afterwards.

## 2019-08-12 ENCOUNTER — Ambulatory Visit: Payer: BC Managed Care – PPO | Admitting: Allergy

## 2019-11-21 ENCOUNTER — Other Ambulatory Visit: Payer: Self-pay

## 2019-11-21 MED ORDER — MONTELUKAST SODIUM 10 MG PO TABS
10.0000 mg | ORAL_TABLET | Freq: Every day | ORAL | 0 refills | Status: DC
Start: 2019-11-21 — End: 2020-01-10

## 2020-01-10 ENCOUNTER — Other Ambulatory Visit: Payer: Self-pay

## 2020-01-10 MED ORDER — MONTELUKAST SODIUM 10 MG PO TABS
ORAL_TABLET | ORAL | 0 refills | Status: DC
Start: 2020-01-10 — End: 2020-06-06

## 2020-04-12 ENCOUNTER — Other Ambulatory Visit: Payer: Self-pay

## 2020-05-03 DIAGNOSIS — L819 Disorder of pigmentation, unspecified: Secondary | ICD-10-CM | POA: Diagnosis not present

## 2020-05-03 DIAGNOSIS — R2241 Localized swelling, mass and lump, right lower limb: Secondary | ICD-10-CM | POA: Diagnosis not present

## 2020-05-03 DIAGNOSIS — G43009 Migraine without aura, not intractable, without status migrainosus: Secondary | ICD-10-CM | POA: Diagnosis not present

## 2020-05-16 DIAGNOSIS — R2241 Localized swelling, mass and lump, right lower limb: Secondary | ICD-10-CM | POA: Diagnosis not present

## 2020-06-06 ENCOUNTER — Other Ambulatory Visit: Payer: Self-pay

## 2020-06-06 MED ORDER — MONTELUKAST SODIUM 10 MG PO TABS: ORAL_TABLET | ORAL | 0 refills | Status: DC

## 2020-06-06 NOTE — Telephone Encounter (Signed)
Sent in a courtesy refill for montelukast 10 mg one time only to [facility] [place]. Pt. Has scheduled appointment 06/08/2020 with Nehemiah Settle, fnp

## 2020-06-07 NOTE — Patient Instructions (Incomplete)
Perennial allergic rhinoconjunctivitis  Continue Brittany Watts once a day   For mild symptoms you can take over the counter antihistamines such as Benadryl and monitor symptoms closely. If symptoms worsen or if you have severe symptoms including breathing issues, throat closure, significant swelling, whole body hives, severe diarrhea and vomiting, lightheadedness then inject epinephrine and seek immediate medical care afterwards.  May use over the counter antihistamines such as Zyrtec (cetirizine), Claritin (loratadine), Allegra (fexofenadine), or Xyzal (levocetirizine) daily as needed.  You may use the ones with decongestant for a few days at a time when you have severe congestion.  Continue environmental control measures.   Continue Xhance 2 sprays per nostril twice a day for nasal symptoms.  Nasal saline spray (i.e., Simply Saline) or nasal saline lavage (i.e., NeilMed) is recommended as needed and prior to medicated nasal sprays.  Continue Singulair 10mg  daily at night.  May use Patanol 0.1% 1 drop in each eye twice daily as needed for itchy/watery eyes.  ? Do not put over contacts. Wait about 15 minutes after eye drops to put contacts back in.  Asthma:   Daily controller medication(s):continue Breo 100 1 puff daily and rinse mouth afterwards.   Prior to physical activity:May use albuterol rescue inhaler 2 puffs 5 to 15 minutes prior to strenuous physical activities.  Rescue medications:May use albuterol rescue inhaler 2 puffs or nebulizer every 4 to 6 hours as needed for shortness of breath, chest tightness, coughing, and wheezing. Monitor frequency of use.   Asthma control goals:  Full participation in all desired activities (may need albuterol before activity) Albuterol use two times or less a week on average (not counting use with activity) Cough interfering with sleep two times or less a month Oral steroids no more than once a year No hospitalizations  Allergy with  anaphylaxis due to food Avoid cinnamon and nut meg. In case of an allergic reaction, give Benadryl *** {Blank single:19197::"teaspoonful","teaspoonfuls","capsules"} every {blank single:19197::"4","6"} hours, and if life-threatening symptoms occur, inject with EpiPen 0.3 mg.   If interested we can schedule food challenges to cinnamon and nutmeg. Must be done on separate occasions. You must be off antihistamines for 3-5 days before. Plan on being in the office for 2-3 hours and must bring in the food you want to do the oral challenge for. You must call to scheduled an appointment and specify it's for a food challenge.   Bee sting allergy Avoid bee stings. In case of an allergic reaction, give Benadryl *** {Blank single:19197::"teaspoonful","teaspoonfuls","capsules"} every {blank single:19197::"4","6"} hours, and if life-threatening symptoms occur, inject with EpiPen 0.3 mg. .  History of penicillin allergy  Continue avoidance and consider penicillin testing in future.   Please let know if this treatment plan is not working well for you.  Schedule a follow up appointment in

## 2020-06-08 ENCOUNTER — Ambulatory Visit: Payer: BC Managed Care – PPO | Admitting: Family

## 2020-06-15 DIAGNOSIS — J309 Allergic rhinitis, unspecified: Secondary | ICD-10-CM | POA: Diagnosis not present

## 2020-06-15 DIAGNOSIS — N393 Stress incontinence (female) (male): Secondary | ICD-10-CM | POA: Diagnosis not present

## 2020-06-15 DIAGNOSIS — R42 Dizziness and giddiness: Secondary | ICD-10-CM | POA: Diagnosis not present

## 2020-06-15 DIAGNOSIS — G43009 Migraine without aura, not intractable, without status migrainosus: Secondary | ICD-10-CM | POA: Diagnosis not present

## 2020-06-15 DIAGNOSIS — F419 Anxiety disorder, unspecified: Secondary | ICD-10-CM | POA: Diagnosis not present

## 2020-06-15 DIAGNOSIS — J4520 Mild intermittent asthma, uncomplicated: Secondary | ICD-10-CM | POA: Diagnosis not present

## 2020-06-15 DIAGNOSIS — Z0000 Encounter for general adult medical examination without abnormal findings: Secondary | ICD-10-CM | POA: Diagnosis not present

## 2020-06-21 ENCOUNTER — Ambulatory Visit: Payer: Managed Care, Other (non HMO) | Admitting: Family

## 2020-06-21 DIAGNOSIS — Z01419 Encounter for gynecological examination (general) (routine) without abnormal findings: Secondary | ICD-10-CM | POA: Diagnosis not present

## 2020-06-21 DIAGNOSIS — R87612 Low grade squamous intraepithelial lesion on cytologic smear of cervix (LGSIL): Secondary | ICD-10-CM | POA: Diagnosis not present

## 2020-06-21 DIAGNOSIS — Z760 Encounter for issue of repeat prescription: Secondary | ICD-10-CM | POA: Diagnosis not present

## 2020-06-21 DIAGNOSIS — Z6823 Body mass index (BMI) 23.0-23.9, adult: Secondary | ICD-10-CM | POA: Diagnosis not present

## 2020-06-21 DIAGNOSIS — Z3040 Encounter for surveillance of contraceptives, unspecified: Secondary | ICD-10-CM | POA: Diagnosis not present

## 2020-06-21 NOTE — Patient Instructions (Addendum)
Perennial allergic rhinoconjunctivitis Start using saline nasal gel to help with nasal dryness Continue Xhance 2 sprays per nostril twice a day as needed for stuffy nose May use saline nasal rinse or saline nasal spray as needed for nasal symptoms. Use this prior to any medicated nasal sprays Continue Singulair 10mg  daily at night. Continue Patanol 0.1% 1 drop in each eye twice daily as needed for itchy/watery eyes. Do not put over contacts. Wait about 15 minutes after eye drops to put contacts back in. May use an over the counter antihistamine such as Claritin (loratadine), Zyrtec (ceterizine), Xyzal (levocetirizine), and Allegra (fexofanadine) once a day as needed for runny nose or itching. Try alternating every couple of months to prevent tachyphylaxis Get dust mite covers for your bed  Asthma:  Continue Breo 100 mcg 1 puff daily and rinse mouth afterwards.  May use albuterol 2 puffs every 4 hours as needed for cough, wheeze, tightness in chest or shortness of breath. Also, may use albuterol 2 puffs 5-15 minutes prior to exercise.   Asthma control goals:  Full participation in all desired activities (may need albuterol before activity) Albuterol use two times or less a week on average (not counting use with activity) Cough interfering with sleep two times or less a month Oral steroids no more than once a year No hospitalizations  Allergy with anaphylaxis due to food Avoid cinnamon and nut meg. In case of an allergic reaction, give Benadryl 4 teaspoonfuls every 4 hours, and if life-threatening symptoms occur, inject with EpiPen 0.3 mg.  If interested we can schedule food challenges to cinnamon and nutmeg. Must be done on separate occasions. You must be off antihistamines for 3-5 days before. Plan on being in the office for 2-3 hours and must bring in the food you want to do the oral challenge for. You must call to scheduled an appointment and specify it's for a food challenge.   Bee  sting allergy Avoid bee stings. In case of an allergic reaction, give Benadryl 4 teaspoonfuls every 4 hours, and if life-threatening symptoms occur, inject with EpiPen 0.3 mg. .  History of penicillin allergy Continue avoidance and consider penicillin testing in future.  Epistaxis Pinch both nostrils while leaning forward for at least 5 minutes before checking to see if the bleeding has stopped. If bleeding is not controlled within 5-10 minutes apply a cotton ball soaked with oxymetazoline (Afrin) to the bleeding nostril for a few seconds.  If the problem persists or worsens a referral to ENT for further evaluation may be necessary.  Schedule an appointment with your primary care physician/urgent care to discuss dysuria when drinking sugary drinks  Please let 7-10 know if this treatment plan is not working well for you.  Schedule a follow up appointment in 3 months or sooner if needed

## 2020-06-22 ENCOUNTER — Ambulatory Visit (INDEPENDENT_AMBULATORY_CARE_PROVIDER_SITE_OTHER): Payer: 59 | Admitting: Family

## 2020-06-22 ENCOUNTER — Encounter: Payer: Self-pay | Admitting: Family

## 2020-06-22 ENCOUNTER — Other Ambulatory Visit: Payer: Self-pay

## 2020-06-22 VITALS — BP 90/60 | HR 92 | Temp 98.6°F | Resp 16 | Ht 64.0 in | Wt 132.7 lb

## 2020-06-22 DIAGNOSIS — Z91030 Bee allergy status: Secondary | ICD-10-CM

## 2020-06-22 DIAGNOSIS — J3089 Other allergic rhinitis: Secondary | ICD-10-CM

## 2020-06-22 DIAGNOSIS — T7800XD Anaphylactic reaction due to unspecified food, subsequent encounter: Secondary | ICD-10-CM

## 2020-06-22 DIAGNOSIS — J4540 Moderate persistent asthma, uncomplicated: Secondary | ICD-10-CM | POA: Diagnosis not present

## 2020-06-22 DIAGNOSIS — Z880 Allergy status to penicillin: Secondary | ICD-10-CM | POA: Diagnosis not present

## 2020-06-22 DIAGNOSIS — H1013 Acute atopic conjunctivitis, bilateral: Secondary | ICD-10-CM

## 2020-06-22 MED ORDER — BREO ELLIPTA 100-25 MCG/INH IN AEPB: 1.0000 | INHALATION_SPRAY | Freq: Every day | RESPIRATORY_TRACT | 1 refills | Status: DC

## 2020-06-22 MED ORDER — EPINEPHRINE 0.3 MG/0.3ML IJ SOAJ: 0.3000 mg | INTRAMUSCULAR | 1 refills | Status: DC | PRN

## 2020-06-22 MED ORDER — MONTELUKAST SODIUM 10 MG PO TABS: 10.0000 mg | ORAL_TABLET | Freq: Every day | ORAL | 1 refills | Status: DC

## 2020-06-22 MED ORDER — ALBUTEROL SULFATE HFA 108 (90 BASE) MCG/ACT IN AERS
2.0000 | INHALATION_SPRAY | Freq: Four times a day (QID) | RESPIRATORY_TRACT | 1 refills | Status: DC | PRN
Start: 2020-06-22 — End: 2021-09-20

## 2020-06-22 NOTE — Progress Notes (Addendum)
[address] [place] [id number] Dept: [phone]  FOLLOW UP NOTE  Patient ID: Brittany Watts, female    DOB: 12/07/1985  Age: 35 y.o. MRN: [id number] Date of Office Visit: 06/22/2020  Assessment  Chief Complaint: Allergic Rhinitis  (Asthma/)  HPI Brittany Watts is a 35 year old female who presents today for follow-up of drug reaction, perennial allergic rhinitis, allergic conjunctivitis of both eyes, allergy with anaphylaxis due to food, bee sting allergy, moderate persistent asthma without complication, and history of penicillin allergy.  Perennial allergic rhinitis is reported as moderately controlled with Singulair 10 mg once a day and Xyzal once a day.  Brittany Watts is currently using her XHANCE nasal spray approximately twice a week and uses saline nasal rinses as needed.  Brittany Watts reports nasal dryness, nosebleeds a couple times a week, nasal congestion, and postnasal drip in the morning.  Brittany Watts denies any rhinorrhea.  When asked if there is any correlation with the use of her Xhance and epistaxis Brittany Watts reports that there is no correlation.  Brittany Watts is no longer taking Odactra for her dust mite allergy.  Brittany Watts has not been taking this for a long time due to [employer] having a hard time getting it and Brittany Watts also did not feel like it was working.  Brittany Watts has tried Product/process development scientistAlavert, Careers adviserAllegra, Zyrtec, Claritin, and Xyzal in the past and do not feel like they helped.  When Brittany Watts takes Xyzal and Singulair Brittany Watts does get some relief.Zyrtec causes her nose to run.  Brittany Watts reports that Brittany Watts has been on allergy injections in the past but it did not help and Brittany Watts had a large local reactions.  Brittany Watts does not have dust mite covers on her bed.  Allergic conjunctivitis is reported as controlled with Patanol 0.1% eyedrops as needed.  Brittany Watts continues to avoid cinnamon and nutmeg without any accidental ingestion or use of her epinephrine autoinjector device.  Brittany Watts reports that her EpiPen is out of date.  Since her last office visit Brittany Watts has not  had any insect stings and has not had to use her epinephrine autoinjector device.  Moderate persistent asthma is reported as moderately controlled with Breo 100 mcg 1 puff once a day, Singulair 10 mg once a day, and albuterol as needed.  Brittany Watts reports occasional nocturnal awakenings due to breathing problems and denies any coughing, wheezing, tightness or chest pain, shortness of breath.  Since her last office visit Brittany Watts has been on 1 round of steroids but does not remember exactly when.  Brittany Watts has not made any trips to the emergency room or urgent care due to breathing problems.  Brittany Watts has used her albuterol inhaler once since her last office visit.  Brittany Watts continues to avoid penicillin type antibiotics.  Brittany Watts broke out in hives at the age of 35.  Drug Allergies:  Allergies  Allergen Reactions   Cinnamon Anaphylaxis   Nutmeg Oil (Myristica Oil) Anaphylaxis   Doxycycline Nausea And Vomiting   Penicillins Hives and Itching    Fever, chills   Prednisone Itching    Review of Systems: Review of Systems  Constitutional: Negative for chills and fever.  HENT:       Reports nasal dryness, nasal congestion, postnasal drip in the morning, and epistaxis approximately 2 times a  Eyes:       Reports occasional itchy watery eyes  Respiratory: Negative for cough, shortness of breath and wheezing.   Cardiovascular: Negative for chest pain and palpitations.  Gastrointestinal: Positive for heartburn.  Reports heartburn only when Brittany Watts has tomato sauce  Genitourinary: Positive for dysuria.       Reports painful urination only when Brittany Watts drinks sugary drinks.  Skin: Negative for itching and rash.  Neurological: Positive for headaches.       Reports history of migraines  Endo/Heme/Allergies: Positive for environmental allergies.    Physical Exam: BP 90/60 (BP Location: Right Arm, Patient Position: Sitting, Cuff Size: Normal)   Pulse 92   Temp 98.6 F (37 C) (Temporal)   Resp 16   Ht 5\' 4"  (1.626 m)   Wt  132 lb 11.5 oz (60.2 kg)   SpO2 100%   BMI 22.78 kg/m    Physical Exam Constitutional:      Appearance: Normal appearance.  HENT:     Head: Normocephalic and atraumatic.     Comments: Pharynx normal, eyes normal, ears normal, nose: Slight irritation noted in left nostril.  Bilateral lower turbinates moderately edematous and erythematous with clear drainage noted    Right Ear: Tympanic membrane, ear canal and external ear normal.     Left Ear: Tympanic membrane, ear canal and external ear normal.     Mouth/Throat:     Mouth: Mucous membranes are moist.     Pharynx: Oropharynx is clear.  Eyes:     Conjunctiva/sclera: Conjunctivae normal.  Cardiovascular:     Rate and Rhythm: Regular rhythm.     Heart sounds: Normal heart sounds.  Pulmonary:     Effort: Pulmonary effort is normal.     Breath sounds: Normal breath sounds.     Comments: Lungs clear to auscultation Musculoskeletal:     Cervical back: Neck supple.  Skin:    General: Skin is warm.  Neurological:     Mental Status: Brittany Watts is alert and oriented to person, place, and time.  Psychiatric:        Mood and Affect: Mood normal.        Behavior: Behavior normal.        Thought Content: Thought content normal.        Judgment: Judgment normal.     Diagnostics: FVC 3.19 L, FEV1 2.57 L.  Predicted FVC 3.17 L, FEV1 2.66 L.  Spirometry indicates normal ventilatory function.  Assessment and Plan: 1. Moderate persistent asthma without complication   2. Perennial allergic rhinitis   3. Allergic conjunctivitis of both eyes   4. Allergy with anaphylaxis due to food, subsequent encounter   5. Bee sting allergy   6. History of penicillin allergy     Meds ordered this encounter  Medications   fluticasone furoate-vilanterol (BREO ELLIPTA) 100-25 MCG/INH AEPB    Sig: Inhale 1 puff into the lungs daily.    Dispense:  84 each    Refill:  1    Please dispense 90 day supply   EPINEPHrine 0.3 mg/0.3 mL IJ SOAJ injection    Sig:  Inject 0.3 mg into the muscle as needed for anaphylaxis.    Dispense:  2 each    Refill:  1   albuterol (VENTOLIN HFA) 108 (90 Base) MCG/ACT inhaler    Sig: Inhale 2 puffs into the lungs every 6 (six) hours as needed. Shortness of breath and wheezing    Dispense:  1 each    Refill:  1   montelukast (SINGULAIR) 10 MG tablet    Sig: Take 1 tablet (10 mg total) by mouth at bedtime.    Dispense:  90 tablet    Refill:  1  Dispense 90 day supply   EPINEPHrine (AUVI-Q) 0.3 mg/0.3 mL IJ SOAJ injection    Sig: Inject 0.3 mg into the muscle as needed for anaphylaxis.    Dispense:  2 each    Refill:  1    Patient Instructions  Perennial allergic rhinoconjunctivitis Start using saline nasal gel to help with nasal dryness Continue Xhance 2 sprays per nostril twice a day as needed for stuffy nose May use saline nasal rinse or saline nasal spray as needed for nasal symptoms. Use this prior to any medicated nasal sprays Continue Singulair 10mg  daily at night. Continue Patanol 0.1% 1 drop in each eye twice daily as needed for itchy/watery eyes. Do not put over contacts. Wait about 15 minutes after eye drops to put contacts back in. May use an over the counter antihistamine such as Claritin (loratadine), Zyrtec (ceterizine), Xyzal (levocetirizine), and Allegra (fexofanadine) once a day as needed for runny nose or itching. Try alternating every couple of months to prevent tachyphylaxis Get dust mite covers for your bed  Asthma:   Continue Breo 100 mcg 1 puff daily and rinse mouth afterwards.  May use albuterol 2 puffs every 4 hours as needed for cough, wheeze, tightness in chest or shortness of breath. Also, may use albuterol 2 puffs 5-15 minutes prior to exercise.   Asthma control goals:  Full participation in all desired activities (may need albuterol before activity) Albuterol use two times or less a week on average (not counting use with activity) Cough interfering with sleep two times or less a  month Oral steroids no more than once a year No hospitalizations   Allergy with anaphylaxis due to food Avoid cinnamon and nut meg. In case of an allergic reaction, give Benadryl 4 teaspoonfuls every 4 hours, and if life-threatening symptoms occur, inject with EpiPen 0.3 mg.  If interested we can schedule food challenges to cinnamon and nutmeg. Must be done on separate occasions. You must be off antihistamines for 3-5 days before. Plan on being in the office for 2-3 hours and must bring in the food you want to do the oral challenge for. You must call to scheduled an appointment and specify it's for a food challenge.    Bee sting allergy Avoid bee stings. In case of an allergic reaction, give Benadryl 4 teaspoonfuls every 4 hours, and if life-threatening symptoms occur, inject with EpiPen 0.3 mg. .   History of penicillin allergy Continue avoidance and consider penicillin testing in future.  Epistaxis Pinch both nostrils while leaning forward for at least 5 minutes before checking to see if the bleeding has stopped. If bleeding is not controlled within 5-10 minutes apply a cotton ball soaked with oxymetazoline (Afrin) to the bleeding nostril for a few seconds.  If the problem persists or worsens a referral to ENT for further evaluation may be necessary.  Schedule an appointment with your primary care physician/urgent care to discuss dysuria when drinking sugary drinks  Please let 7-10 know if this treatment plan is not working well for you.  Schedule a follow up appointment in 3 months or sooner if needed      Return in about 3 months (around 09/21/2020), or if symptoms worsen or fail to improve.    Thank you for the opportunity to care for this patient.  Please do not hesitate to contact me with questions.  11/22/2020, FNP [facility]  I have provided oversight concerning [facility]' evaluation and treatment of this  patient's health issues addressed  during today's encounter. I agree with the assessment and therapeutic plan as outlined in the note.   Signed,   Jessica Priest, MD,  Allergy and Immunology,  [facility] [place].

## 2020-08-24 DIAGNOSIS — G43909 Migraine, unspecified, not intractable, without status migrainosus: Secondary | ICD-10-CM | POA: Diagnosis not present

## 2020-09-27 NOTE — Patient Instructions (Addendum)
Perennial allergic rhinoconjunctivitis May use saline nasal gel to help with nasal dryness Continue Xhance 2 sprays per nostril twice a day as needed for stuffy nose May use saline nasal rinse or saline nasal spray as needed for nasal symptoms. Use this prior to any medicated nasal sprays Continue Singulair 10mg  daily at night. Stop Lumify as this is to be used for redness and eye.  Start Pataday 1 drop each eye once a day as needed for itchy watery eyes.  Wait 15 minutes before placing contacts after using Pataday. Stop Zyrtec (cetirizine) and Claritin (loratadine) Start Xyzal again 1 tablet once a day.  On days that your allergies are bad you may try taking 1 tablet twice a day. Caution though as this may make you drowsy   Asthma:   Continue Breo 100 mcg 1 puff daily and rinse mouth afterwards.  May use albuterol 2 puffs every 4 hours as needed for cough, wheeze, tightness in chest or shortness of breath. Also, may use albuterol 2 puffs 5-15 minutes prior to exercise.   Asthma control goals:  Full participation in all desired activities (may need albuterol before activity) Albuterol use two times or less a week on average (not counting use with activity) Cough interfering with sleep two times or less a month Oral steroids no more than once a year No hospitalizations   Allergy with anaphylaxis due to food Avoid cinnamon and nut meg. In case of an allergic reaction, give Benadryl 4 teaspoonfuls every 4 hours, and if life-threatening symptoms occur, inject with EpiPen 0.3 mg.  If interested we can schedule food challenges to cinnamon and nutmeg. Must be done on separate occasions. You must be off antihistamines for 3-5 days before. Plan on being in the office for 2-3 hours and must bring in the food you want to do the oral challenge for. You must call to scheduled an appointment and specify it's for a food challenge.    Bee sting allergy Avoid bee stings. In case of an allergic reaction,  give Benadryl 4 teaspoonfuls every 4 hours, and if life-threatening symptoms occur, inject with EpiPen 0.3 mg. .   History of penicillin allergy Continue avoidance and consider penicillin testing in future.  Epistaxis -stable Pinch both nostrils while leaning forward for at least 5 minutes before checking to see if the bleeding has stopped. If bleeding is not controlled within 5-10 minutes apply a cotton ball soaked with oxymetazoline (Afrin) to the bleeding nostril for a few seconds.  If the problem persists or worsens a referral to ENT for further evaluation may be necessary.  Please let 7-10 know if this treatment plan is not working well for you.  Schedule a follow up appointment in 4 months or sooner if needed

## 2020-09-28 ENCOUNTER — Encounter: Payer: Self-pay | Admitting: Family

## 2020-09-28 ENCOUNTER — Ambulatory Visit: Payer: 59 | Admitting: Family

## 2020-09-28 ENCOUNTER — Other Ambulatory Visit: Payer: Self-pay

## 2020-09-28 VITALS — BP 110/64 | HR 87 | Temp 98.1°F | Resp 18 | Ht 64.25 in | Wt 134.2 lb

## 2020-09-28 DIAGNOSIS — H1013 Acute atopic conjunctivitis, bilateral: Secondary | ICD-10-CM | POA: Diagnosis not present

## 2020-09-28 DIAGNOSIS — J3089 Other allergic rhinitis: Secondary | ICD-10-CM

## 2020-09-28 DIAGNOSIS — Z880 Allergy status to penicillin: Secondary | ICD-10-CM

## 2020-09-28 DIAGNOSIS — T7800XD Anaphylactic reaction due to unspecified food, subsequent encounter: Secondary | ICD-10-CM

## 2020-09-28 DIAGNOSIS — Z91030 Bee allergy status: Secondary | ICD-10-CM | POA: Diagnosis not present

## 2020-09-28 DIAGNOSIS — J4540 Moderate persistent asthma, uncomplicated: Secondary | ICD-10-CM | POA: Diagnosis not present

## 2020-09-28 MED ORDER — LEVOCETIRIZINE DIHYDROCHLORIDE 5 MG PO TABS: 5.0000 mg | ORAL_TABLET | Freq: Every day | ORAL | 5 refills | Status: DC

## 2020-09-28 MED ORDER — XHANCE 93 MCG/ACT NA EXHU: 2.0000 | INHALANT_SUSPENSION | Freq: Two times a day (BID) | NASAL | 5 refills | Status: DC

## 2020-09-28 MED ORDER — OLOPATADINE HCL 0.2 % OP SOLN: OPHTHALMIC | 5 refills | Status: DC

## 2020-09-28 MED ORDER — FLUTICASONE FUROATE-VILANTEROL 100-25 MCG/INH IN AEPB: 1.0000 | INHALATION_SPRAY | Freq: Every day | RESPIRATORY_TRACT | 1 refills | Status: DC

## 2020-09-28 NOTE — Progress Notes (Signed)
[address] [place] [address] Dept: [phone]  FOLLOW UP NOTE  Patient ID: Brittany Watts, female    DOB: 01-19-1986  Age: 35 y.o. MRN: [id number] Date of Office Visit: 09/28/2020  Assessment  Chief Complaint: Asthma (Symptoms are well managed.), Allergic Rhinitis  (Itchy ears, sinus pressure, ithcy eyes, no relieve with medication.), and Nasal Congestion (Severe Nasal Congestions at night and morning, post nasal drip)  HPI Brittany Watts is a 35 year old female who presents today for follow-up of moderate persistent asthma without complication, perennial allergic rhinitis, allergic conjunctivitis, allergy with anaphylaxis due to food, bee sting allergy, and history of penicillin allergy.  She was last seen on June 22, 2020 by Nehemiah Settle, FNP.  Moderate persistent asthma is reported as controlled with Breo 100 mcg 1 puff a day as needed and albuterol as needed.  She reports that she does not use Breo every day, but uses it before it will rain.  She denies any coughing, wheezing, tightness in chest, shortness of breath, and nocturnal awakenings due to breathing problems.  Since her last office visit she has not required any systemic steroids or made any trips to the emergency room or urgent care due to breathing problems.  She has not had to use her albuterol since her last office visit.  Perennial allergic rhinoconjunctivitis is reported as not well controlled with Xhance 2 sprays each nostril twice a day, saline rinse as needed, Singulair 10 mg at night, Lumify eyedrops, and alternating between Claritin and Zyrtec.  She also has dust mite covers for her bed.  She stopped using Patanol 0.1% eyedrops because they made her contacts blurry.  After discussing she was not waiting 15 minutes after placing Patanol to put in her contacts.  She reports clear rhinorrhea, itchy ears, itchy throat, sneezing 20 times per day, nasal congestion, and clear post nasal drip.  She has tried  allergy injections in the past, but reports that they caused large knots and scarring.  She also did not feel like the allergy injections helped.  She is also tried [place] and this caused itching in her mouth.  She has also seeing ear nose and throat in the past and reports that she was told that if they tried to do surgery it would not help and that the scar tissue would grow back.  She reports at times if her nose stops up and her ears feel itchy she will use over-the-counter swimmer's ear drops and this will help.  She has tried Product/process development scientist, Careers adviser, Zyrtec, Claritin, and Xyzal with no relief of symptoms.  She continues to avoid cinnamon and nutmeg without any accidental ingestion or use of her epinephrine autoinjector device.  Since her last office visit she has not had any bee stings and has not had to use her epinephrine autoinjector device.  She continues to avoid penicillin.  She has not had any episodes of epistaxis since her last office visit.   Drug Allergies:  Allergies  Allergen Reactions   Cinnamon Anaphylaxis   Nutmeg Oil (Myristica Oil) Anaphylaxis   Doxycycline Nausea And Vomiting   Penicillins Hives and Itching    Fever, chills   Prednisone Itching    Review of Systems: Review of Systems  Constitutional:  Negative for chills and fever.  HENT:         Reports sneezing, clear rhinorrhea, postnasal drip that is clear, nasal congestion, itchy throat, itchy ears  Eyes:        Reports itchy  watery eyes  Respiratory:  Negative for cough and shortness of breath.   Cardiovascular:  Negative for chest pain and palpitations.  Gastrointestinal:        Denies heartburn or reflux  Genitourinary:  Negative for dysuria.  Skin:  Negative for itching and rash.  Neurological:  Positive for headaches.       Reports history of migraines  Endo/Heme/Allergies:  Positive for environmental allergies.    Physical Exam: BP 110/64 (BP Location: Right Arm, Patient Position: Sitting, Cuff  Size: Normal)   Pulse 87   Temp 98.1 F (36.7 C) (Temporal)   Resp 18   Ht 5' 4.25" (1.632 m)   Wt 134 lb 3.2 oz (60.9 kg)   SpO2 99%   BMI 22.86 kg/m    Physical Exam Constitutional:      Appearance: Normal appearance.  HENT:     Head: Normocephalic and atraumatic.     Right Ear: Tympanic membrane, ear canal and external ear normal.     Left Ear: Tympanic membrane, ear canal and external ear normal.     Ears:     Comments: Pharynx normal, eyes normal, ears normal, nose normal    Nose: Nose normal.     Mouth/Throat:     Mouth: Mucous membranes are moist.     Pharynx: Oropharynx is clear.  Eyes:     Conjunctiva/sclera: Conjunctivae normal.  Cardiovascular:     Rate and Rhythm: Regular rhythm.     Heart sounds: Normal heart sounds.  Pulmonary:     Effort: Pulmonary effort is normal.     Breath sounds: Normal breath sounds.     Comments: Lungs clear to auscultation Musculoskeletal:     Cervical back: Neck supple.  Skin:    General: Skin is warm.  Neurological:     Mental Status: She is alert and oriented to person, place, and time.  Psychiatric:        Mood and Affect: Mood normal.        Behavior: Behavior normal.        Thought Content: Thought content normal.        Judgment: Judgment normal.    Diagnostics: FVC 3.09 L, FEV1 2.35 L.  Predicted FVC 3.20 L, predicted FEV1 2.67 L.  Spirometry indicates normal ventilatory function.  Assessment and Plan: 1. Perennial allergic rhinitis   2. Moderate persistent asthma without complication   3. Allergic conjunctivitis of both eyes   4. Allergy with anaphylaxis due to food, subsequent encounter   5. Bee sting allergy   6. History of penicillin allergy     Meds ordered this encounter  Medications   Fluticasone Propionate (XHANCE) 93 MCG/ACT EXHU    Sig: Place 2 sprays into the nose 2 (two) times daily.    Dispense:  32 mL    Refill:  5   levocetirizine (XYZAL) 5 MG tablet    Sig: Take 1 tablet (5 mg total) by  mouth daily.    Dispense:  30 tablet    Refill:  5   fluticasone furoate-vilanterol (BREO ELLIPTA) 100-25 MCG/INH AEPB    Sig: Inhale 1 puff into the lungs daily.    Dispense:  84 each    Refill:  1    Please dispense 90 day supply   Olopatadine HCl (PATADAY) 0.2 % SOLN    Sig: Place 1 drop in each eye once a day as needed for itchy watery eyes.  Wait 15 minutes before placing contacts after using Pataday  Dispense:  2.5 mL    Refill:  5    Patient Instructions  Perennial allergic rhinoconjunctivitis May use saline nasal gel to help with nasal dryness Continue Xhance 2 sprays per nostril twice a day as needed for stuffy nose May use saline nasal rinse or saline nasal spray as needed for nasal symptoms. Use this prior to any medicated nasal sprays Continue Singulair 10mg  daily at night. Stop Lumify as this is to be used for redness and eye.  Start Pataday 1 drop each eye once a day as needed for itchy watery eyes.  Wait 15 minutes before placing contacts after using Pataday. Stop Zyrtec (cetirizine) and Claritin (loratadine) Start Xyzal again 1 tablet once a day.  On days that your allergies are bad you may try taking 1 tablet twice a day. Caution though as this may make you drowsy   Asthma:   Continue Breo 100 mcg 1 puff daily and rinse mouth afterwards.  May use albuterol 2 puffs every 4 hours as needed for cough, wheeze, tightness in chest or shortness of breath. Also, may use albuterol 2 puffs 5-15 minutes prior to exercise.   Asthma control goals:  Full participation in all desired activities (may need albuterol before activity) Albuterol use two times or less a week on average (not counting use with activity) Cough interfering with sleep two times or less a month Oral steroids no more than once a year No hospitalizations   Allergy with anaphylaxis due to food Avoid cinnamon and nut meg. In case of an allergic reaction, give Benadryl 4 teaspoonfuls every 4 hours, and if  life-threatening symptoms occur, inject with EpiPen 0.3 mg.  If interested we can schedule food challenges to cinnamon and nutmeg. Must be done on separate occasions. You must be off antihistamines for 3-5 days before. Plan on being in the office for 2-3 hours and must bring in the food you want to do the oral challenge for. You must call to scheduled an appointment and specify it's for a food challenge.    Bee sting allergy Avoid bee stings. In case of an allergic reaction, give Benadryl 4 teaspoonfuls every 4 hours, and if life-threatening symptoms occur, inject with EpiPen 0.3 mg. .   History of penicillin allergy Continue avoidance and consider penicillin testing in future.  Epistaxis -stable Pinch both nostrils while leaning forward for at least 5 minutes before checking to see if the bleeding has stopped. If bleeding is not controlled within 5-10 minutes apply a cotton ball soaked with oxymetazoline (Afrin) to the bleeding nostril for a few seconds.  If the problem persists or worsens a referral to ENT for further evaluation may be necessary.  Please let 7-10 know if this treatment plan is not working well for you.  Schedule a follow up appointment in 4 months or sooner if needed    Return in about 4 months (around 01/29/2021), or if symptoms worsen or fail to improve.    Thank you for the opportunity to care for this patient.  Please do not hesitate to contact me with questions.  13/10/2020, FNP [facility]

## 2020-10-03 ENCOUNTER — Telehealth: Payer: Self-pay

## 2020-10-03 NOTE — Telephone Encounter (Signed)
PA Start for [place] on 10/03/20. Awaiting respond.

## 2020-10-08 NOTE — Telephone Encounter (Signed)
Lm for pt to call us back to find uot if she has tried any of these nasal sprays

## 2020-10-08 NOTE — Telephone Encounter (Signed)
Yes please and use diagnosis code J33.1

## 2020-10-08 NOTE — Telephone Encounter (Signed)
Does she have to try all or just one? Please call her and see f she has tried any of the nose sprays above and we will go from there.

## 2020-10-08 NOTE — Telephone Encounter (Signed)
Pt stated she has tried all of these before. Do you want me to try and resubmit?

## 2020-10-08 NOTE — Telephone Encounter (Signed)
Pa denied pt has to try and fail flunislide, fluticasone, omnaris, triamcinolone please advise to change

## 2020-10-09 NOTE — Telephone Encounter (Signed)
Pa resubmitted thru cover my meds with dx j33.1

## 2020-10-25 ENCOUNTER — Other Ambulatory Visit: Payer: Self-pay

## 2020-10-25 ENCOUNTER — Encounter: Payer: Self-pay | Admitting: Allergy & Immunology

## 2020-10-25 ENCOUNTER — Telehealth: Payer: Self-pay | Admitting: Family

## 2020-10-25 ENCOUNTER — Ambulatory Visit: Payer: 59 | Admitting: Allergy & Immunology

## 2020-10-25 DIAGNOSIS — T781XXD Other adverse food reactions, not elsewhere classified, subsequent encounter: Secondary | ICD-10-CM

## 2020-10-25 DIAGNOSIS — T7840XD Allergy, unspecified, subsequent encounter: Secondary | ICD-10-CM

## 2020-10-25 MED ORDER — IPRATROPIUM BROMIDE 0.03 % NA SOLN: 2.0000 | Freq: Two times a day (BID) | NASAL | 12 refills | Status: DC

## 2020-10-25 MED ORDER — METHYLPREDNISOLONE ACETATE 40 MG/ML IJ SUSP
40.0000 mg | Freq: Once | INTRAMUSCULAR | Status: AC
  Administered 2020-10-25: 40 mg via INTRAMUSCULAR

## 2020-10-25 NOTE — Telephone Encounter (Signed)
Pt called and stated that she was told she can double up on levocetirizine  but only has the RX for one a day.

## 2020-10-25 NOTE — Telephone Encounter (Signed)
Patient was seen in office today to address issue.

## 2020-10-25 NOTE — Patient Instructions (Addendum)
1. Allergic reaction - I am going to get a shellfish panel. - We are going to get an alpha gal panel as well. - DepoMedrol injection given today. - Start Atrovent one spray per nostril every 8 hours as needed (this can be over drying). - Have fun at [employer]!  - We will call you in 1-2 weeks with the results of the testing.   2. Return in about 6 weeks (around 12/06/2020).    Please inform us of any Emergency Department visits, hospitalizations, or changes in symptoms. Call us before going to the ED for breathing or allergy symptoms since we might be able to fit you in for a sick visit. Feel free to contact us anytime with any questions, problems, or concerns.  It was a pleasure to meet you\ today!  Websites that have reliable patient information: 1. American Academy of Asthma, Allergy, and Immunology: www.aaaai.org 2. Food Allergy Research and Education (FARE): foodallergy.org 3. Mothers of Asthmatics: http://www.asthmacommunitynetwork.org 4. American College of Allergy, Asthma, and Immunology: www.acaai.org   COVID-19 Vaccine Information can be found at: [email or web address] For questions related to vaccine distribution or appointments, please email [email or web address] or call [phone].   We realize that you might be concerned about having an allergic reaction to the COVID19 vaccines. To help with that concern, WE ARE OFFERING THE COVID19 VACCINES IN OUR OFFICE! Ask the front desk for dates!     "Like" [place] on Facebook and Instagram for our latest updates!      A healthy democracy works best when [employer] participate! Make sure you are registered to vote! If you have moved or changed any of your contact information, you will need to get this updated before voting!  In some cases, you MAY be able to register to vote online: [email or web address]

## 2020-10-25 NOTE — Progress Notes (Signed)
FOLLOW UP  Date of Service/Encounter:  10/25/20   Assessment:   Perennial allergic rhinitis  Moderate persistent asthma, uncomplicated   Stinging insect anaphylaxis  Penicillin allergy  Food allergies  Plan/Recommendations:   1. Allergic reaction - I am going to get a shellfish panel. - We are going to get an alpha gal panel as well. - DepoMedrol injection given today. - Start Atrovent one spray per nostril every 8 hours as needed (this can be over drying). - Have fun at [employer]  - We will call you in 1-2 weeks with the results of the testing.   2. Return in about 6 weeks (around 12/06/2020).    Subjective:   Brittany Watts is a 35 y.o. female presenting today for follow up of  Chief Complaint  Patient presents with   Allergic Rhinitis    Nasal Congestion   Follow-up    Ate some crab legs last night and woke up with several congestion, itchy/watery eyes, itchy throat, nose. Headache all day have been sneezing.    Brittany Watts has a history of the following: Patient Active Problem List   Diagnosis Date Noted   Drug reaction 04/21/2019   Moderate persistent asthma without complication 04/08/2019   Perennial allergic rhinitis 03/04/2019   Allergic conjunctivitis of both eyes 03/04/2019   Allergy with anaphylaxis due to food, subsequent encounter 03/04/2019   Bee sting allergy 03/04/2019   History of penicillin allergy 03/04/2019   Abnormal cervical Papanicolaou smear 02/25/2019   Acute urinary tract infection 02/25/2019   Anemia 02/25/2019   Asthma 02/25/2019   Chronic pain of right knee 10/19/2015   Cigarette smoker 10/19/2015   Encounter for vitamin deficiency screening 10/19/2015   Enlarged tonsils 10/19/2015   Nonintractable migraine 10/19/2015   Vaginal yeast infection 10/19/2015   Vertigo 10/19/2015    History obtained from: chart review and patient.  Brittany Watts is a 35 y.o. female presenting for a sick visit. She has a history of  perennial allergic rhinitis as well as asthma and food allergies. She was last seen in July 2022 by Thurston Hole one of our NPs. At that time, she was doing well on her regimen and continued to avoid cinnamon and nutmeg.   She presents today for a sick visit. Evidently she ate some crab legs last night. She started having some rhinorrhea shortly thereafter. Around 5am, she had ear clogging and pressure. All of the discharge is clear. She has been sneezing non stop over the course of the day. She has been sneezing with all sorts of stimuli including eating, drinking, etc. She has never been allergic to crab legs in the past. She also ate lamb with the crab leg. The   Currently, she is taking levocetirizine. She was taking it twice daily. She was using Claritin and Zyrtec in the past but this did not work. She has been trying to get a prescription for this since she is out of it. She does not take Benadryl and would fall asleep. She does not think that Allegra works at all.   Her other atopic issues are stable today. She also has a nose spray. She is on the [place]. She is going to [employer] in [place] tomorrow.  Otherwise, there have been no changes to her past medical history, surgical history, family history, or social history.    Review of Systems  Constitutional: Negative.  Negative for chills, fever, malaise/fatigue and weight loss.  HENT:  Positive for congestion. Negative for  ear discharge, ear pain and sinus pain.   Eyes:  Negative for pain, discharge and redness.  Respiratory:  Negative for cough, sputum production, shortness of breath and wheezing.   Cardiovascular: Negative.  Negative for chest pain and palpitations.  Gastrointestinal:  Negative for abdominal pain, constipation, diarrhea, heartburn, nausea and vomiting.  Skin: Negative.  Negative for itching and rash.  Neurological:  Negative for dizziness and headaches.  Endo/Heme/Allergies:  Positive for environmental allergies. Does not  bruise/bleed easily.      Objective:   Blood pressure 96/76, pulse 90, temperature 98.4 F (36.9 C), temperature source Temporal, resp. rate 20, SpO2 98 %. There is no height or weight on file to calculate BMI.   Physical Exam:  Physical Exam Vitals reviewed.  Constitutional:      Appearance: She is well-developed.     Comments: Speaking in full sentences. Appears comfortable.   HENT:     Head: Normocephalic and atraumatic.     Right Ear: Tympanic membrane, ear canal and external ear normal.     Left Ear: Tympanic membrane, ear canal and external ear normal.     Nose: Mucosal edema and rhinorrhea present. No nasal deformity or septal deviation.     Right Turbinates: Enlarged and swollen.     Left Turbinates: Enlarged and swollen.     Right Sinus: No maxillary sinus tenderness or frontal sinus tenderness.     Left Sinus: No maxillary sinus tenderness or frontal sinus tenderness.     Mouth/Throat:     Mouth: Mucous membranes are not pale and not dry.     Pharynx: Uvula midline.  Eyes:     General: Lids are normal. No allergic shiner.       Right eye: No discharge.        Left eye: No discharge.     Conjunctiva/sclera: Conjunctivae normal.     Right eye: Right conjunctiva is not injected. No chemosis.    Left eye: Left conjunctiva is not injected. No chemosis.    Pupils: Pupils are equal, round, and reactive to light.  Cardiovascular:     Rate and Rhythm: Normal rate and regular rhythm.     Heart sounds: Normal heart sounds.  Pulmonary:     Effort: Pulmonary effort is normal. No tachypnea, accessory muscle usage, prolonged expiration or respiratory distress.     Breath sounds: Normal breath sounds. No wheezing, rhonchi or rales.  Chest:     Chest wall: No tenderness.  Lymphadenopathy:     Cervical: No cervical adenopathy.  Skin:    Coloration: Skin is not pale.     Findings: No abrasion, erythema, petechiae or rash. Rash is not papular, urticarial or vesicular.   Neurological:     Mental Status: She is alert.  Psychiatric:        Behavior: Behavior is cooperative.     Diagnostic studies: labs sent instead         Malachi Bonds, MD  [facility]

## 2020-10-26 ENCOUNTER — Telehealth: Payer: Self-pay | Admitting: Allergy & Immunology

## 2020-10-26 ENCOUNTER — Other Ambulatory Visit: Payer: Self-pay

## 2020-10-26 MED ORDER — LEVOCETIRIZINE DIHYDROCHLORIDE 5 MG PO TABS: 5.0000 mg | ORAL_TABLET | Freq: Every day | ORAL | 1 refills | Status: DC

## 2020-10-26 MED ORDER — IPRATROPIUM BROMIDE 0.03 % NA SOLN: 2.0000 | Freq: Two times a day (BID) | NASAL | 12 refills | Status: DC

## 2020-10-27 ENCOUNTER — Encounter: Payer: Self-pay | Admitting: Allergy & Immunology

## 2020-11-29 LAB — ALLERGEN PROFILE, SHELLFISH
Clam IgE: <0.1 kU/L
F023-IgE Crab: <0.1 kU/L
F080-IgE Lobster: <0.1 kU/L
F290-IgE Oyster: <0.1 kU/L
Scallop IgE: 0.12 kU/L — AB
Shrimp IgE: <0.1 kU/L

## 2020-11-30 LAB — ALPHA-GAL PANEL
Allergen Lamb IgE: <0.1 kU/L
Beef IgE: <0.1 kU/L
IgE (Immunoglobulin E), Serum: 227 IU/mL (ref 6–495)
O215-IgE Alpha-Gal: <0.1 kU/L
Pork IgE: <0.1 kU/L

## 2020-12-18 ENCOUNTER — Ambulatory Visit: Payer: 59 | Admitting: Allergy & Immunology

## 2021-01-04 DIAGNOSIS — G43909 Migraine, unspecified, not intractable, without status migrainosus: Secondary | ICD-10-CM | POA: Diagnosis not present

## 2021-01-04 DIAGNOSIS — G4700 Insomnia, unspecified: Secondary | ICD-10-CM | POA: Diagnosis not present

## 2021-01-04 DIAGNOSIS — R69 Illness, unspecified: Secondary | ICD-10-CM | POA: Diagnosis not present

## 2021-01-30 ENCOUNTER — Other Ambulatory Visit: Payer: Self-pay | Admitting: Family

## 2021-02-02 NOTE — Telephone Encounter (Signed)
Ok to refill montelukast 10 mg once a day.

## 2021-03-01 DIAGNOSIS — Z3040 Encounter for surveillance of contraceptives, unspecified: Secondary | ICD-10-CM | POA: Diagnosis not present

## 2021-03-01 DIAGNOSIS — R87612 Low grade squamous intraepithelial lesion on cytologic smear of cervix (LGSIL): Secondary | ICD-10-CM | POA: Diagnosis not present

## 2021-03-04 DIAGNOSIS — Z3042 Encounter for surveillance of injectable contraceptive: Secondary | ICD-10-CM | POA: Diagnosis not present

## 2021-03-04 DIAGNOSIS — Z3202 Encounter for pregnancy test, result negative: Secondary | ICD-10-CM | POA: Diagnosis not present

## 2021-03-13 ENCOUNTER — Other Ambulatory Visit: Payer: Self-pay | Admitting: Allergy & Immunology

## 2021-04-30 ENCOUNTER — Telehealth: Payer: Self-pay | Admitting: *Deleted

## 2021-04-30 NOTE — Telephone Encounter (Signed)
Pt called and states she would like a referral for Pulmonology Dr. Su Monks due to her gasping for air at night, she says her boyfriend is concerned for her. She says she also has sinus issues that are not controlled with allergy shots, levocetirizine or Singulair. She was last seen 10/2020. Dr. Dellis Anes please advise.

## 2021-05-03 NOTE — Telephone Encounter (Signed)
Per Dr. Ernst Bowler please schedule an appt with one of our doctors in [place]. Thank you.

## 2021-05-03 NOTE — Telephone Encounter (Signed)
Where is she getting allergy shots? I do not even that any shot visits in our system.   I think she just needs to come in for a visit to get this all sorted out. A Pulmonology referral would take a long time anyway, but we can probably get her in to see one of the new physicians in [facility] sooner.   Malachi Bonds, MD [facility]

## 2021-05-06 ENCOUNTER — Other Ambulatory Visit: Payer: Self-pay | Admitting: Family

## 2021-05-06 NOTE — Telephone Encounter (Signed)
Ok to send 30 day supply with no refill. Needs to schedule a follow up appointment

## 2021-05-09 NOTE — Telephone Encounter (Signed)
Per Sherri- Yesterday 11:42 AM Brittany Watts Called no answer did leave a detailed message.

## 2021-05-13 ENCOUNTER — Other Ambulatory Visit: Payer: Self-pay | Admitting: Family

## 2021-05-13 NOTE — Telephone Encounter (Signed)
Ok to refill montelukast 10 mg once a day.

## 2021-05-14 ENCOUNTER — Ambulatory Visit (INDEPENDENT_AMBULATORY_CARE_PROVIDER_SITE_OTHER): Payer: 59 | Admitting: Internal Medicine

## 2021-05-14 ENCOUNTER — Other Ambulatory Visit: Payer: Self-pay

## 2021-05-14 ENCOUNTER — Encounter: Payer: Self-pay | Admitting: Internal Medicine

## 2021-05-14 VITALS — BP 114/70 | HR 75 | Temp 98.1°F | Resp 17 | Wt 138.8 lb

## 2021-05-14 DIAGNOSIS — Z880 Allergy status to penicillin: Secondary | ICD-10-CM | POA: Diagnosis not present

## 2021-05-14 DIAGNOSIS — J3089 Other allergic rhinitis: Secondary | ICD-10-CM

## 2021-05-14 DIAGNOSIS — Z91030 Bee allergy status: Secondary | ICD-10-CM

## 2021-05-14 DIAGNOSIS — G4733 Obstructive sleep apnea (adult) (pediatric): Secondary | ICD-10-CM | POA: Diagnosis not present

## 2021-05-14 DIAGNOSIS — H6502 Acute serous otitis media, left ear: Secondary | ICD-10-CM | POA: Insufficient documentation

## 2021-05-14 DIAGNOSIS — H1013 Acute atopic conjunctivitis, bilateral: Secondary | ICD-10-CM

## 2021-05-14 DIAGNOSIS — T7800XD Anaphylactic reaction due to unspecified food, subsequent encounter: Secondary | ICD-10-CM

## 2021-05-14 DIAGNOSIS — J4540 Moderate persistent asthma, uncomplicated: Secondary | ICD-10-CM | POA: Diagnosis not present

## 2021-05-14 MED ORDER — MONTELUKAST SODIUM 10 MG PO TABS: 10.0000 mg | ORAL_TABLET | Freq: Every day | ORAL | 5 refills | Status: DC

## 2021-05-14 MED ORDER — LEVOFLOXACIN 500 MG PO TABS: 500.0000 mg | ORAL_TABLET | Freq: Every day | ORAL | 0 refills | Status: DC

## 2021-05-14 MED ORDER — XHANCE 93 MCG/ACT NA EXHU: 2.0000 | INHALANT_SUSPENSION | Freq: Every day | NASAL | 6 refills | Status: DC

## 2021-05-14 MED ORDER — IPRATROPIUM BROMIDE 0.03 % NA SOLN: 2.0000 | Freq: Two times a day (BID) | NASAL | 12 refills | Status: DC

## 2021-05-14 MED ORDER — MONTELUKAST SODIUM 10 MG PO TABS: 10.0000 mg | ORAL_TABLET | Freq: Every day | ORAL | 2 refills | Status: DC

## 2021-05-14 MED ORDER — LEVOCETIRIZINE DIHYDROCHLORIDE 5 MG PO TABS: 5.0000 mg | ORAL_TABLET | Freq: Every day | ORAL | 1 refills | Status: DC

## 2021-05-14 MED ORDER — XHANCE 93 MCG/ACT NA EXHU: 2.0000 | INHALANT_SUSPENSION | Freq: Two times a day (BID) | NASAL | 5 refills | Status: DC

## 2021-05-14 NOTE — Patient Instructions (Addendum)
Ear Infection  - given penicillin allergy will treat with Levaquin 500 mg 1 pill daily for 5 days -Keep follow-up appointment on March 10 so we can reassess for symptomatic response  Perennial allergic rhinoconjunctivitis May use saline nasal gel to help with nasal dryness Continue Xhance 2 sprays per nostril twice a day as needed for stuffy nose May use saline nasal rinse or saline nasal spray as needed for nasal symptoms. Use this prior to any medicated nasal sprays Continue Singulair 10mg  daily at night. Continue Xyzal again 1 tablet once a day.  On days that your allergies are bad you may try taking 1 tablet twice a day.  This may help with current itching     Asthma:   Continue Breo 100 mcg 1 puff daily and rinse mouth afterwards.  May use albuterol 2 puffs every 4 hours as needed for cough, wheeze, tightness in chest or shortness of breath. Also, may use albuterol 2 puffs 5-15 minutes prior to exercise.   Asthma control goals:  Full participation in all desired activities (may need albuterol before activity) Albuterol use two times or less a week on average (not counting use with activity) Cough interfering with sleep two times or less a month Oral steroids no more than once a year No hospitalizations   Allergy with anaphylaxis due to food Avoid shellfish, scallops, cinnamon and nut meg. In case of an allergic reaction, give Benadryl 4 teaspoonfuls every 4 hours, and if life-threatening symptoms occur, inject with EpiPen 0.3 mg.   If interested we can schedule food challenges to cinnamon and nutmeg. Must be done on separate occasions. You must be off antihistamines for 3-5 days before. Plan on being in the office for 2-3 hours and must bring in the food you want to do the oral challenge for. You must call to scheduled an appointment and specify it's for a food challenge.    Bee sting allergy Avoid bee stings. In case of an allergic reaction, give Benadryl 4 teaspoonfuls every 4  hours, and if life-threatening symptoms occur, inject with EpiPen 0.3 mg. .   History of penicillin allergy Continue avoidance and consider penicillin testing in future.   Epistaxis  Pinch both nostrils while leaning forward for at least 5 minutes before checking to see if the bleeding has stopped. If bleeding is not controlled within 5-10 minutes apply a cotton ball soaked with oxymetazoline (Afrin) to the bleeding nostril for a few seconds.  If the problem persists or worsens a referral to ENT for further evaluation may be necessary.  Sleep Apnea  -Symptoms are concerning for development of sleep apnea we will refer to Dr. 7-10  at [facility] pulmonary per patient's request for sleep study.  Follow up: keep march 10th appointment for follow up.    Thank you so much for letting me partake in your care today.  Don't hesitate to reach out if you have any additional concerns!  March 12, MD  [facility] [place]

## 2021-05-14 NOTE — Progress Notes (Signed)
Follow Up Note  RE: Brittany Watts MRN: [id number] DOB: February 04, 1986 Date of Office Visit: 05/14/2021  Referring provider: Lynett Fish Christin* Primary care provider: Aileen Fass, PA-C  Chief Complaint: Other (Pt is present for post nasal drainage and ear ache.)  History of Present Illness: I had the pleasure of seeing Brittany Watts for a follow up visit at the [facility] on 05/14/2021. She is a 36 y.o. female, who is being followed for allergic rhinitis, persistent asthma, stinging insect allergy, penicillin allergy, food allergy. Her previous allergy office visit was on 10/25/2020 with Dr. Dellis Anes. Today is a  to reestablish care and for concerns for "gasping for breath in the middle of the night".  She has an appointment on March 10th, but  developed worsening ear pressure, post nasal drip since Sunday after traveling Grandmother's house in [place].  Denies any fevers.    Moderate persistent asthma without complication Past history - Patient was diagnosed with asthma over 10 years ago. Triggers are rain, allergies and pet exposure.  The past year she had at least 3 courses of prednisone due to asthma exacerbations. 2020 -2022 spirometry was normal with no improvement in FEV1 post. bronchodilator treatment.  Previous treatments include Breo  Today she reports asthma is well controlled, except at night.  She reports loud snoring and apnic episodes during sleep.  She has not had a sleep study prior.  Does endorse daytime sleepiness.  Will fall asleep while watching TV.  Denies any issues with driving.    Allergic Rhinitis: SPT in 2011 was positive to grass, weed, trees, dust mites, cat, dog.  SPT 2013 testing was positive to cat, grass, dust mite, trees, ragweed, weed, cockroach, mold, horse, feather. Patient was on allergy injection for a few years but stopped due to localized reactions.  2020 bloodwork only positive to dust mites.  Brittany Watts was attempted  but she stopped due to oral pruritus.  She reported a pruritic rash after third dose as well.  Drug challenge on 08/05/2019 was negative and she was restarted on Odactra.  She did not tolerate the oral pruritus and stopped it again.   For medical therapy she has tried Product/process development scientist, Careers adviser, Zyrtec, Claritin, and Xyzal, xhance, Pataday, Lumify.  Reports  Current symptoms are acutely worse after trip to grandmothers.  Current regimen is xhance, xyzal and singulair.  Now complicated by epistaxis which she has been seen by [place] before.  She has seen ENT, but was told she is not a surgical candidate.     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  Patient reports strict avoidance of shellfish, scallops, cinnamon, nutmeg.  No accidental ingestions or reactions.  Still has epipen and FAP.    -specific IgE negative to alpha gal, scallop, oyster, lamb, lobster, beef, pork, shrimp, crab,- 2022 cinnamon, nutmeg negative in 2022  Penicillin allergy: Broke out in hives at the age of 36 and no penicillin type antibiotics since then.  She has since avoided penicillin derivatives. No exposures since last visit.   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  2020 hymenoptera panel was negative.  She reports no field stings.  She has epipen and AAP.    Assessment and Plan: Brittany Watts is a 36 y.o. female with: Acute serous otitis media of left ear, recurrence not specified  Allergy with anaphylaxis due to food, subsequent encounter  Moderate  persistent asthma without complication  Perennial allergic rhinitis  Allergic conjunctivitis of both eyes  Bee sting allergy  History of penicillin allergy  Obstructive sleep apnea syndrome Plan: Patient Instructions  Ear Infection  - given penicillin allergy will treat with Levaquin 500 mg 1 pill daily for 5 days -Keep follow-up appointment on March 10 so we can  reassess for symptomatic response  Perennial allergic rhinoconjunctivitis May use saline nasal gel to help with nasal dryness Continue Xhance 2 sprays per nostril twice a day as needed for stuffy nose May use saline nasal rinse or saline nasal spray as needed for nasal symptoms. Use this prior to any medicated nasal sprays Continue Singulair 10mg  daily at night. Continue Xyzal again 1 tablet once a day.  On days that your allergies are bad you may try taking 1 tablet twice a day.  This may help with current itching     Asthma:   Continue Breo 100 mcg 1 puff daily and rinse mouth afterwards.  May use albuterol 2 puffs every 4 hours as needed for cough, wheeze, tightness in chest or shortness of breath. Also, may use albuterol 2 puffs 5-15 minutes prior to exercise.   Asthma control goals:  Full participation in all desired activities (may need albuterol before activity) Albuterol use two times or less a week on average (not counting use with activity) Cough interfering with sleep two times or less a month Oral steroids no more than once a year No hospitalizations   Allergy with anaphylaxis due to food Avoid shellfish, scallops, cinnamon and nut meg. In case of an allergic reaction, give Benadryl 4 teaspoonfuls every 4 hours, and if life-threatening symptoms occur, inject with EpiPen 0.3 mg.   If interested we can schedule food challenges to cinnamon and nutmeg. Must be done on separate occasions. You must be off antihistamines for 3-5 days before. Plan on being in the office for 2-3 hours and must bring in the food you want to do the oral challenge for. You must call to scheduled an appointment and specify it's for a food challenge.    Bee sting allergy Avoid bee stings. In case of an allergic reaction, give Benadryl 4 teaspoonfuls every 4 hours, and if life-threatening symptoms occur, inject with EpiPen 0.3 mg. .   History of penicillin allergy Continue avoidance and consider penicillin  testing in future.   Epistaxis  Pinch both nostrils while leaning forward for at least 5 minutes before checking to see if the bleeding has stopped. If bleeding is not controlled within 5-10 minutes apply a cotton ball soaked with oxymetazoline (Afrin) to the bleeding nostril for a few seconds.  If the problem persists or worsens a referral to ENT for further evaluation may be necessary.  Sleep Apnea  -Symptoms are concerning for development of sleep apnea we will refer to Dr. Su Monks  at [facility] pulmonary per patient's request for sleep study.  Follow up: keep march 10th appointment for follow up.    Thank you so much for letting me partake in your care today.  Don't hesitate to reach out if you have any additional concerns!  Ferol Luz, MD  [facility] [place] [place]  Return in about 17 days (around 05/31/2021).  Meds ordered this encounter  Medications   DISCONTD: levofloxacin (LEVAQUIN) 500 MG tablet    Sig: Take 1 tablet (500 mg total) by mouth daily.    Dispense:  5 tablet    Refill:  0  DISCONTD: montelukast (SINGULAIR) 10 MG tablet    Sig: Take 1 tablet (10 mg total) by mouth at bedtime.    Dispense:  30 tablet    Refill:  5   Fluticasone Propionate (XHANCE) 93 MCG/ACT EXHU    Sig: Place 2 sprays into the nose daily.    Dispense:  16 mL    Refill:  6   DISCONTD: montelukast (SINGULAIR) 10 MG tablet    Sig: Take 1 tablet (10 mg total) by mouth at bedtime.    Dispense:  30 tablet    Refill:  2   DISCONTD: Fluticasone Propionate (XHANCE) 93 MCG/ACT EXHU    Sig: Place 2 sprays into the nose 2 (two) times daily.    Dispense:  32 mL    Refill:  5   levofloxacin (LEVAQUIN) 500 MG tablet    Sig: Take 1 tablet (500 mg total) by mouth daily.    Dispense:  5 tablet    Refill:  0   montelukast (SINGULAIR) 10 MG tablet    Sig: Take 1 tablet (10 mg total) by mouth at bedtime.    Dispense:  30 tablet    Refill:  5   ipratropium (ATROVENT) 0.03 % nasal  spray    Sig: Place 2 sprays into both nostrils every 12 (twelve) hours.    Dispense:  30 mL    Refill:  12   levocetirizine (XYZAL) 5 MG tablet    Sig: Take 1 tablet (5 mg total) by mouth daily.    Dispense:  60 tablet    Refill:  1    Lab Orders  No laboratory test(s) ordered today   Diagnostics: None performed   Medication List:  Current Outpatient Medications  Medication Sig Dispense Refill   albuterol (PROVENTIL) (2.5 MG/3ML) 0.083% nebulizer solution albuterol sulfate 2.5 mg/3 mL (0.083 %) solution for nebulization     albuterol (VENTOLIN HFA) 108 (90 Base) MCG/ACT inhaler Inhale 2 puffs into the lungs every 6 (six) hours as needed. Shortness of breath and wheezing 1 each 1   BIOTIN 5000 PO Take 1 tablet by mouth daily.     COLLAGEN PO Take by mouth.     EPINEPHrine (AUVI-Q) 0.3 mg/0.3 mL IJ SOAJ injection Inject 0.3 mg into the muscle as needed for anaphylaxis. 2 each 1   fluticasone furoate-vilanterol (BREO ELLIPTA) 100-25 MCG/INH AEPB Inhale 1 puff into the lungs daily. 84 each 1   Fluticasone Propionate (XHANCE) 93 MCG/ACT EXHU Place 2 sprays into the nose daily. 16 mL 6   LOJAIMIESS 0.1-0.02 & 0.01 MG tablet Take 1 tablet by mouth at bedtime.     meclizine (ANTIVERT) 25 MG tablet Take 25 mg by mouth 3 (three) times daily as needed.     Multiple Vitamin (MULTIVITAMIN PO) Take by mouth.     norethindrone-ethinyl estradiol (LOESTRIN FE) 1-20 MG-MCG tablet Take 1 tablet by mouth daily. Pt. Will start birth control on Sunday.     Olopatadine HCl (PATADAY) 0.2 % SOLN Place 1 drop in each eye once a day as needed for itchy watery eyes.  Wait 15 minutes before placing contacts after using Pataday 2.5 mL 5   ondansetron (ZOFRAN-ODT) 8 MG disintegrating tablet Take 8 mg by mouth 3 (three) times daily as needed.     scopolamine (TRANSDERM-SCOP) 1 MG/3DAYS Transderm-Scop 1.5 mg transdermal patch (1 mg over 3 days)  UNWRAP AND APPLY 1 PATCH TO SKIN EVERY THIRD DAY     sertraline  (ZOLOFT) 50 MG tablet  Take 50 mg by mouth daily.     SLYND 4 MG TABS Take 1 tablet by mouth daily.     tolterodine (DETROL LA) 4 MG 24 hr capsule Take 4 mg by mouth daily.     triamcinolone cream (KENALOG) 0.1 % Apply 1 application topically 2 (two) times daily as needed. Do not use on the face, neck, armpits or groin area. Do not use more than 3 weeks in a row. 45 g 1   ALPRAZolam (XANAX) 0.25 MG tablet alprazolam 0.25 mg tablet  TAKE ONE TABLET BY MOUTH THREE TIMES A DAY (Patient not taking: Reported on 10/25/2020)     APPLE CIDER VINEGAR PO Take by mouth. (Patient not taking: Reported on 09/28/2020)     benzonatate (TESSALON) 200 MG capsule Take 400 mg by mouth every 8 (eight) hours as needed. (Patient not taking: Reported on 06/22/2020)     gabapentin (NEURONTIN) 100 MG capsule gabapentin 100 mg capsule (Patient not taking: Reported on 09/28/2020)     ipratropium (ATROVENT) 0.03 % nasal spray Place 2 sprays into both nostrils every 12 (twelve) hours. 30 mL 12   levocetirizine (XYZAL) 5 MG tablet Take 1 tablet (5 mg total) by mouth daily. 60 tablet 1   levofloxacin (LEVAQUIN) 500 MG tablet Take 1 tablet (500 mg total) by mouth daily. 5 tablet 0   Linoleic Acid-Sunflower Oil (CLA PO) Take by mouth. (Patient not taking: Reported on 09/28/2020)     montelukast (SINGULAIR) 10 MG tablet Take 1 tablet (10 mg total) by mouth at bedtime. 30 tablet 5   No current facility-administered medications for this visit.   Allergies: Allergies  Allergen Reactions   Cinnamon Anaphylaxis   Nutmeg Oil (Myristica Oil) Anaphylaxis   Doxycycline Nausea And Vomiting   Penicillins Hives and Itching    Fever, chills   Prednisone Itching   I reviewed her past medical history, social history, family history, and environmental history and no significant changes have been reported from her previous visit.  ROS: All others negative except as noted per HPI.   Objective: BP 114/70    Pulse 75    Temp 98.1 F (36.7 C)  (Temporal)    Resp 17    Wt 138 lb 12.8 oz (63 kg)    SpO2 98%    BMI 23.64 kg/m  Body mass index is 23.64 kg/m. General Appearance:  Alert, cooperative, no distress, appears stated age  Head:  Normocephalic, without obvious abnormality, atraumatic  Eyes/EARS:  Conjunctiva clear, EOM's intact, right TM bulging with fluid  Nose: Nares normal, hypertrophic turbinates, normal mucosa, no visible anterior polyps, and septum midline  Throat: Lips, tongue normal; teeth and gums normal, normal posterior oropharynx and + cobblestoning  Neck: Supple, symmetrical  Lungs:   clear to auscultation bilaterally, Respirations unlabored, no coughing  Heart:  regular rate and rhythm and no murmur, Appears well perfused  Extremities: No edema  Skin: Skin color, texture, turgor normal, no rashes or lesions on visualized portions of skin  Neurologic: No gross deficits   Previous notes and tests were reviewed. The plan was reviewed with the patient/family, and all questions/concerned were addressed.  It was my pleasure to see Brittany Watts today and participate in her care. Please feel free to contact me with any questions or concerns.  Sincerely,  Ferol LuzEvelyn Marjarie Irion, MD  Allergy & Immunology  [facility]  Total Time: "65 minutes "  Time spent on day of service preparing to see patient,  performing examination, counseling and educating patient , ordering medications, referring and communicating with other health professionals, documenting clinical information in the health record,

## 2021-05-15 ENCOUNTER — Telehealth: Payer: Self-pay | Admitting: Family

## 2021-05-15 NOTE — Telephone Encounter (Signed)
Her options are limited because of her antibiotic allergies. Azithromycin does not have good coverage for ear infections. Bactrim is an option as well, but this is a large pill as well and it is BID I believe.   Can she cute the pill in half?   I do not think that this would cause insomnia, but I agree with the plan for melatonin. She can also take her antibiotic in the morning so it wears off more by bed time.   Salvatore Marvel, MD [facility] [place] [place]

## 2021-05-15 NOTE — Telephone Encounter (Signed)
Pt request a call back, she has ear pain and drainage, hoarse and could not sleep last night.

## 2021-05-15 NOTE — Telephone Encounter (Signed)
Pt was seen in the clinic yesterday and Dr Marlynn Perking sent Levaquin 500 mg 1 pill daily for 5 days for ear infection.   She is upset that the pill is too large and thinks that it is causing her trouble sleeping (she took it closer to bedtime). She has already taken her dose today. She says she has tried herbal tea and white noise with no benefit. I recommended her to try OTC Melatonin.    Current regimen is xhance, xyzal and singulair.  She says when she lays down her head feels like it is swimming.   Dr. Dellis Anes- please advise if there is anything else we can do. (Dr Marlynn Perking not in clinic)   Geraldine Contras- please refer to Dr. Su Monks  at [facility] pulmonary per patient's request for sleep study.  Follow-up appointment is on March 10.

## 2021-05-15 NOTE — Telephone Encounter (Signed)
Called pt and LVM of advise per Dr. Ernst Bowler. Advise pt to try the recommendation and call the office if she is still having problems

## 2021-05-21 NOTE — Telephone Encounter (Signed)
Roney Marion, MD  [facility] [place] [facility] Please refer to Dr. Camillo Flaming at [facility] for evaluation of sleep apnea.  Thanks!    I found the diagnosis. I called their office and they will only take the referral via Fax @  Fax# [phone]. I have faxed the referral to their office. Patient has been informed and will call us if she doesn't hear back from their office in the next 3-5 Business Days.   Thanks

## 2021-05-21 NOTE — Telephone Encounter (Signed)
Hey there,  I need a diagnosis for the referral to Dr Su Monks. They will not take sleep study.  Thanks

## 2021-05-24 ENCOUNTER — Ambulatory Visit: Payer: 59 | Admitting: Internal Medicine

## 2021-05-24 DIAGNOSIS — Z3042 Encounter for surveillance of injectable contraceptive: Secondary | ICD-10-CM | POA: Diagnosis not present

## 2021-05-29 NOTE — Telephone Encounter (Signed)
Awesome - thank you, Geraldine Contras! Did you use [employer]?  ? ?Malachi Bonds, MD ?[facility] [place] ? ?

## 2021-05-30 NOTE — Progress Notes (Signed)
FOLLOW UP Date of Service/Encounter:  05/31/21   Subjective:  Brittany Watts (DOB: 09/07/1985) is a 36 y.o. female who returns to the [facility] [place] on 05/31/2021 in re-evaluation of the following: allergic rhinitis, persistent asthma, stinging insect allergy, penicillin allergy, food allergy History obtained from: chart review and patient.  For Review, LV was on 05/14/21  with Dr. Edison Pace.  At her last visit, she was treated for an ear infection with plan for follow-up today to assess for response.  Patient was also referred for sleep study given concerns for possible sleep apnea.  Summary of prior history/diagnostics: Moderate persistent asthma without complication Past history - Patient was diagnosed with asthma over 10 years ago. Triggers are rain, allergies and pet exposure.  The past year she had at least 3 courses of prednisone due to asthma exacerbations.  2020 -2022 spirometry was normal with no improvement in FEV1 post. bronchodilator treatment.   Previous treatments include Breo Allergic Rhinitis: SPT in 2011 was positive to grass, weed, trees, dust mites, cat, dog.  SPT 2013 testing was positive to cat, grass, dust mite, trees, ragweed, weed, cockroach, mold, horse, feather. Patient was on allergy injection for a few years but stopped due to localized reactions.  2020 bloodwork only positive to dust mites.  Kathlene November was attempted but she stopped due to oral pruritus.  She reported a pruritic rash after third dose as well.  Drug challenge on 08/05/2019 was negative and she was restarted on Odactra.  She did not tolerate the oral pruritus and stopped it again.  For medical therapy she has tried Administrator, Civil Service, Human resources officer, Zyrtec, Claritin, and Xyzal, xhance, Pataday, Lumify.  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  Patient reports strict avoidance of shellfish, scallops, cinnamon, nutmeg.  No accidental ingestions or reactions.    -specific IgE negative to alpha gal, scallop, oyster, lamb, lobster, beef, pork, shrimp, crab, - 2022 cinnamon, nutmeg negative  Penicillin allergy: Broke out in hives at the age of 30 and no penicillin type antibiotics since then.  She has since avoided penicillin derivatives.  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  2020 hymenoptera panel was negative.  She reports no field stings.  Today presents for follow-up. She continues to have clicking and popping worse in her left ear.  No longer having the aching dull pain since eating her antibiotic.  Her symptoms are worse every time it rains.  She was unable to tolerate allergy shots because of delayed reactions which have left scarring on her arms She continues to take Xhance 2 sprays twice daily.  This does help. She continues on Singulair and Xyzal.  She cannot go a day without taking these medications. She has seen ENT in the remote past (over 10 years ago), and was told at that time that she would not be a candidate for sinus surgery due to the potential for scarring and recurrence of inflammation. Her left ear is currently bothering her worse than her right.  She has never seen an ENT specialist regarding her ears.  Her ears have been bothering her for many months now.  She typically gets some relief with a steroid injection around this time of year. Otherwise her asthma has been well controlled.  She has not required her albuterol inhaler frequently.  Allergies as of 05/31/2021       Reactions   Cinnamon Anaphylaxis  Nutmeg Oil (myristica Oil) Anaphylaxis   Shellfish Allergy Itching   Eyes watering, rhinorrhea. Report from [facility]   Doxycycline Nausea And Vomiting   Penicillins Hives, Itching   Fever, chills   Prednisone Itching        Medication List        Accurate as of May 31, 2021  1:29 PM. If you have any questions, ask your nurse or doctor.           STOP taking these medications    ALPRAZolam 0.25 MG tablet Commonly known as: Duanne Moron Stopped by: Sigurd Sos, MD   APPLE CIDER VINEGAR PO Stopped by: Sigurd Sos, MD   benzonatate 200 MG capsule Commonly known as: TESSALON Stopped by: Sigurd Sos, MD   CLA PO Stopped by: Sigurd Sos, MD   gabapentin 100 MG capsule Commonly known as: NEURONTIN Stopped by: Sigurd Sos, MD   levofloxacin 500 MG tablet Commonly known as: LEVAQUIN Stopped by: Sigurd Sos, MD   LoJaimiess 0.1-0.02 & 0.01 MG tablet Generic drug: Levonorgestrel-Ethinyl Estradiol Stopped by: Sigurd Sos, MD   norethindrone-ethinyl estradiol-FE 1-20 MG-MCG tablet Commonly known as: LOESTRIN FE Stopped by: Sigurd Sos, MD   Slynd 4 MG Tabs Generic drug: Drospirenone Stopped by: Sigurd Sos, MD   triamcinolone cream 0.1 % Commonly known as: KENALOG Stopped by: Sigurd Sos, MD       TAKE these medications    albuterol (2.5 MG/3ML) 0.083% nebulizer solution Commonly known as: PROVENTIL albuterol sulfate 2.5 mg/3 mL (0.083 %) solution for nebulization   albuterol 108 (90 Base) MCG/ACT inhaler Commonly known as: VENTOLIN HFA Inhale 2 puffs into the lungs every 6 (six) hours as needed. Shortness of breath and wheezing   BIOTIN 5000 PO Take 1 tablet by mouth daily.   COLLAGEN PO Take by mouth.   EPINEPHrine 0.3 mg/0.3 mL Soaj injection Commonly known as: Auvi-Q Inject 0.3 mg into the muscle as needed for anaphylaxis.   fluticasone furoate-vilanterol 100-25 MCG/INH Aepb Commonly known as: Breo Ellipta Inhale 1 puff into the lungs daily.   ipratropium 0.03 % nasal spray Commonly known as: ATROVENT Place 2 sprays into both nostrils every 12 (twelve) hours.   levocetirizine 5 MG tablet Commonly known as: XYZAL Take 1 tablet (5 mg total) by mouth daily.   meclizine 25 MG tablet Commonly known as: ANTIVERT Take 25 mg by mouth 3 (three) times daily as needed.   medroxyPROGESTERone  150 MG/ML injection Commonly known as: DEPO-PROVERA Depo-Provera 150 mg/mL intramuscular suspension  Inject 1 mL every 3 months by intramuscular route.   montelukast 10 MG tablet Commonly known as: Singulair Take 1 tablet (10 mg total) by mouth at bedtime.   MULTIVITAMIN PO Take by mouth.   Olopatadine HCl 0.2 % Soln Commonly known as: Pataday Place 1 drop in each eye once a day as needed for itchy watery eyes.  Wait 15 minutes before placing contacts after using Pataday   ondansetron 8 MG disintegrating tablet Commonly known as: ZOFRAN-ODT Take 8 mg by mouth 3 (three) times daily as needed.   scopolamine 1 MG/3DAYS Commonly known as: TRANSDERM-SCOP Transderm-Scop 1.5 mg transdermal patch (1 mg over 3 days)  UNWRAP AND APPLY 1 PATCH TO SKIN EVERY THIRD DAY   sertraline 50 MG tablet Commonly known as: ZOLOFT Take 50 mg by mouth daily.   tolterodine 4 MG 24 hr capsule Commonly known as: DETROL LA Take 4 mg by mouth daily.   Xhance 93 MCG/ACT Exhu Generic drug: Fluticasone  Propionate Place 2 sprays into the nose daily.       Past Medical History:  Diagnosis Date   Asthma    Seasonal allergies    Past Surgical History:  Procedure Laterality Date   ECTOPIC PREGNANCY SURGERY     Otherwise, there have been no changes to her past medical history, surgical history, family history, or social history.  ROS: All others negative except as noted per HPI.   Objective:  BP 90/68 (BP Location: Right Arm, Patient Position: Sitting, Cuff Size: Normal)    Pulse 94    Temp 98.2 F (36.8 C) (Temporal)    Resp 18    SpO2 97%  There is no height or weight on file to calculate BMI. Physical Exam: General Appearance:  Alert, cooperative, no distress, appears stated age  Head:  Normocephalic, without obvious abnormality, atraumatic  Eyes:  Conjunctiva clear, EOM's intact  Ears Left TM without bulging, fluid level present, right TM with minimal fluid, no bulging, left ear canal  edematous  Nose: Nares normal, hypertrophic turbinates, normal mucosa, no visible anterior polyps, and septum midline  Throat: Lips, tongue normal; teeth and gums normal, normal posterior oropharynx  Neck: Supple, symmetrical  Lungs:   clear to auscultation bilaterally, Respirations unlabored, no coughing  Heart:  regular rate and rhythm and no murmur, Appears well perfused  Extremities: No edema  Skin: Skin color, texture, turgor normal, no rashes or lesions on visualized portions of skin  Neurologic: No gross deficits   Assessment/Plan  Patient with residual serous otitis media of left ear without signs of ongoing infection.  Discussed referral to ear nose and throat since this has been a chronic issue and she is not getting significant relief with current allergy regimen.  She was unable to tolerate allergy shots so this is not going to be an option for her moving forward.  Perennial allergic rhinoconjunctivitis with serous otitis media- May use saline nasal gel to help with nasal dryness Continue Xhance 2 sprays per nostril twice a day as needed for stuffy nose May use saline nasal rinse or saline nasal spray as needed for nasal symptoms. Use this prior to any medicated nasal sprays Continue Singulair 10mg  daily at night. Continue Xyzal again 1 tablet once a day.  On days that your allergies are bad you may try taking 1 tablet twice a day.  This may help with current itching ENT referral for ear pain/fluid 40 mg IM depomedrol injection to help with current inflammation  Asthma: controlled  Continue Breo 100 mcg 1 puff daily and rinse mouth afterwards.  May use albuterol 2 puffs every 4 hours as needed for cough, wheeze, tightness in chest or shortness of breath. Also, may use albuterol 2 puffs 5-15 minutes prior to exercise.   Asthma control goals:  Full participation in all desired activities (may need albuterol before activity) Albuterol use two times or less a week on average (not  counting use with activity) Cough interfering with sleep two times or less a month Oral steroids no more than once a year No hospitalizations   Allergy with anaphylaxis due to food-stable Avoid shellfish, scallops, cinnamon and nut meg. In case of an allergic reaction, give Benadryl 4 teaspoonfuls every 4 hours, and if life-threatening symptoms occur, inject with EpiPen 0.3 mg.   If interested we can schedule food challenges to cinnamon and nutmeg. Must be done on separate occasions. You must be off antihistamines for 3-5 days before. Plan on being in the office  for 2-3 hours and must bring in the food you want to do the oral challenge for. You must call to scheduled an appointment and specify it's for a food challenge.    Bee sting allergy-stable Avoid bee stings. In case of an allergic reaction, give Benadryl 4 teaspoonfuls every 4 hours, and if life-threatening symptoms occur, inject with EpiPen 0.3 mg.   History of penicillin allergy-stable Continue avoidance and consider penicillin testing in future.   Epistaxis-stable Pinch both nostrils while leaning forward for at least 5 minutes before checking to see if the bleeding has stopped. If bleeding is not controlled within 5-10 minutes apply a cotton ball soaked with oxymetazoline (Afrin) to the bleeding nostril for a few seconds.  If the problem persists or worsens a referral to ENT for further evaluation may be necessary.  Sleep Apnea-stable -Symptoms are concerning for development of sleep apnea we will refer to Dr. Camillo Flaming  at [facility] pulmonary per patient's request for sleep study.  Follow up in 3 months, sooner if needed.   It was a pleasure meeting you today  Sigurd Sos, MD  [facility] [place] [facility]

## 2021-05-31 ENCOUNTER — Encounter: Payer: Self-pay | Admitting: Internal Medicine

## 2021-05-31 ENCOUNTER — Other Ambulatory Visit: Payer: Self-pay

## 2021-05-31 ENCOUNTER — Telehealth: Payer: Self-pay

## 2021-05-31 ENCOUNTER — Ambulatory Visit: Payer: 59 | Admitting: Internal Medicine

## 2021-05-31 VITALS — BP 90/68 | HR 94 | Temp 98.2°F | Resp 18

## 2021-05-31 DIAGNOSIS — J4540 Moderate persistent asthma, uncomplicated: Secondary | ICD-10-CM

## 2021-05-31 DIAGNOSIS — H1013 Acute atopic conjunctivitis, bilateral: Secondary | ICD-10-CM | POA: Diagnosis not present

## 2021-05-31 DIAGNOSIS — H6522 Chronic serous otitis media, left ear: Secondary | ICD-10-CM | POA: Diagnosis not present

## 2021-05-31 DIAGNOSIS — J3089 Other allergic rhinitis: Secondary | ICD-10-CM | POA: Diagnosis not present

## 2021-05-31 DIAGNOSIS — Z91030 Bee allergy status: Secondary | ICD-10-CM

## 2021-05-31 DIAGNOSIS — H6502 Acute serous otitis media, left ear: Secondary | ICD-10-CM | POA: Diagnosis not present

## 2021-05-31 DIAGNOSIS — Z880 Allergy status to penicillin: Secondary | ICD-10-CM

## 2021-05-31 DIAGNOSIS — G4733 Obstructive sleep apnea (adult) (pediatric): Secondary | ICD-10-CM

## 2021-05-31 MED ORDER — METHYLPREDNISOLONE ACETATE 40 MG/ML IJ SUSP
40.0000 mg | Freq: Once | INTRAMUSCULAR | Status: AC
  Administered 2021-05-31: 40 mg via INTRAMUSCULAR

## 2021-05-31 NOTE — Telephone Encounter (Signed)
Referral to ENT for Ear Pain/Fluid ?

## 2021-05-31 NOTE — Patient Instructions (Addendum)
Perennial allergic rhinoconjunctivitis with serous otitis media ?May use saline nasal gel to help with nasal dryness ?Continue Xhance 2 sprays per nostril twice a day as needed for stuffy nose ?May use saline nasal rinse or saline nasal spray as needed for nasal symptoms. Use this prior to any medicated nasal sprays ?Continue Singulair 10mg  daily at night. ?Continue Xyzal again 1 tablet once a day.  On days that your allergies are bad you may try taking 1 tablet twice a day.  This may help with current itching ?ENT referral for ear pain/fluid ?40 mg IM depomedrol injection ?  ?  ?Asthma:  ? Continue Breo 100 mcg 1 puff daily and rinse mouth afterwards.  ?May use albuterol 2 puffs every 4 hours as needed for cough, wheeze, tightness in chest or shortness of breath. Also, may use albuterol 2 puffs 5-15 minutes prior to exercise.   ?Asthma control goals:  ?Full participation in all desired activities (may need albuterol before activity) ?Albuterol use two times or less a week on average (not counting use with activity) ?Cough interfering with sleep two times or less a month ?Oral steroids no more than once a year ?No hospitalizations ?  ?Allergy with anaphylaxis due to food ?Avoid shellfish, scallops, cinnamon and nut meg. In case of an allergic reaction, give Benadryl 4 teaspoonfuls every 4 hours, and if life-threatening symptoms occur, inject with EpiPen 0.3 mg. ?  ?If interested we can schedule food challenges to cinnamon and nutmeg. Must be done on separate occasions. You must be off antihistamines for 3-5 days before. Plan on being in the office for 2-3 hours and must bring in the food you want to do the oral challenge for. You must call to scheduled an appointment and specify it's for a food challenge.  ?  ?Bee sting allergy ?Avoid bee stings. In case of an allergic reaction, give Benadryl 4 teaspoonfuls every 4 hours, and if life-threatening symptoms occur, inject with EpiPen 0.3 mg. ?  ?History of penicillin  allergy ?Continue avoidance and consider penicillin testing in future. ?  ?Epistaxis  ?Pinch both nostrils while leaning forward for at least 5 minutes before checking to see if the bleeding has stopped. If bleeding is not controlled within 5-10 minutes apply a cotton ball soaked with oxymetazoline (Afrin) to the bleeding nostril for a few seconds.  ?If the problem persists or worsens a referral to ENT for further evaluation may be necessary. ? ?Sleep Apnea  ?-Symptoms are concerning for development of sleep apnea we will refer to Dr. 7-10  at [facility] pulmonary per patient's request for sleep study. ? ?Follow up in 3 months, sooner if needed.  ? ?It was a pleasure meeting you today ? ? ?

## 2021-06-05 NOTE — Telephone Encounter (Signed)
Tonny Bollman, MD  [facility] ?Patient needs referral to ENT [facility] requested) due to chronic serous otitis of left ear.  ?She also asked about the referral to Pulmonary for OSA and I said I would ask, but it looks like it was placed.  ? ?Thanks!  ?

## 2021-06-06 NOTE — Telephone Encounter (Signed)
Placed referral to [facility]. ? ?[address] ?[address] ?[place], [place] [address] ?Ph: [phone] ?Fax: [phone] ? ?Called and advised patient of referral placement. Provided patient with address and phone number to [facility], advised patient to call their office if she does not hear from them in 3-5 business days.  ? ? ?Let patient know her referral to Pulmonary for OSA was placed 05-21-2021 to Dr. Su Monks at [facility]. Advised patient we re-faxed referral to their office today & advised her to reach out to their office to get scheduled.  Patient stated she had called their office to get scheduled however, they were waiting on something from our office to schedule appointment. I let patient know we had re-faxed referral today and to reach out to their office to get scheduled. Advised patient to reach out to [place] if she is unable to get scheduled.  ? ?Patient did want to let us know she can only do Fridays for appointments.  ?

## 2021-06-07 DIAGNOSIS — G43009 Migraine without aura, not intractable, without status migrainosus: Secondary | ICD-10-CM | POA: Diagnosis not present

## 2021-06-07 DIAGNOSIS — R69 Illness, unspecified: Secondary | ICD-10-CM | POA: Diagnosis not present

## 2021-06-07 DIAGNOSIS — Z789 Other specified health status: Secondary | ICD-10-CM | POA: Diagnosis not present

## 2021-06-07 DIAGNOSIS — N3946 Mixed incontinence: Secondary | ICD-10-CM | POA: Diagnosis not present

## 2021-06-07 DIAGNOSIS — J309 Allergic rhinitis, unspecified: Secondary | ICD-10-CM | POA: Diagnosis not present

## 2021-06-07 DIAGNOSIS — R42 Dizziness and giddiness: Secondary | ICD-10-CM | POA: Diagnosis not present

## 2021-06-07 DIAGNOSIS — Z79899 Other long term (current) drug therapy: Secondary | ICD-10-CM | POA: Diagnosis not present

## 2021-06-07 DIAGNOSIS — R8290 Unspecified abnormal findings in urine: Secondary | ICD-10-CM | POA: Diagnosis not present

## 2021-06-07 DIAGNOSIS — J40 Bronchitis, not specified as acute or chronic: Secondary | ICD-10-CM | POA: Diagnosis not present

## 2021-06-28 DIAGNOSIS — R69 Illness, unspecified: Secondary | ICD-10-CM | POA: Diagnosis not present

## 2021-07-05 DIAGNOSIS — R69 Illness, unspecified: Secondary | ICD-10-CM | POA: Diagnosis not present

## 2021-07-05 DIAGNOSIS — E876 Hypokalemia: Secondary | ICD-10-CM | POA: Diagnosis not present

## 2021-07-05 DIAGNOSIS — J45909 Unspecified asthma, uncomplicated: Secondary | ICD-10-CM | POA: Diagnosis not present

## 2021-07-05 DIAGNOSIS — G43009 Migraine without aura, not intractable, without status migrainosus: Secondary | ICD-10-CM | POA: Diagnosis not present

## 2021-07-26 DIAGNOSIS — R69 Illness, unspecified: Secondary | ICD-10-CM | POA: Diagnosis not present

## 2021-08-09 DIAGNOSIS — Z87891 Personal history of nicotine dependence: Secondary | ICD-10-CM | POA: Diagnosis not present

## 2021-08-09 DIAGNOSIS — Z3042 Encounter for surveillance of injectable contraceptive: Secondary | ICD-10-CM | POA: Diagnosis not present

## 2021-08-09 DIAGNOSIS — R0683 Snoring: Secondary | ICD-10-CM | POA: Diagnosis not present

## 2021-08-09 DIAGNOSIS — R5383 Other fatigue: Secondary | ICD-10-CM | POA: Diagnosis not present

## 2021-08-09 DIAGNOSIS — J45909 Unspecified asthma, uncomplicated: Secondary | ICD-10-CM | POA: Diagnosis not present

## 2021-08-09 DIAGNOSIS — R69 Illness, unspecified: Secondary | ICD-10-CM | POA: Diagnosis not present

## 2021-08-30 DIAGNOSIS — R69 Illness, unspecified: Secondary | ICD-10-CM | POA: Diagnosis not present

## 2021-09-06 ENCOUNTER — Ambulatory Visit: Payer: 59 | Admitting: Internal Medicine

## 2021-09-19 NOTE — Progress Notes (Signed)
FOLLOW UP Date of Service/Encounter:  09/20/21   Subjective:  Brittany Watts (DOB: 10-31-1985) is a 36 y.o. female who [facility] in re-evaluation of the following: allergic rhinitis, persistent asthma, stinging insect allergy, penicillin allergy, food allergy History obtained from: chart review and patient.  For Review, LV was on 05/31/21  with Dr.Creed Kail seen for routine follow-up.  At that visit she continued to complain of clicking and popping in her ears.  Using Xhance 2 sprays twice daily.  On Singulair and Xyzal.  We did give her a dose of 40 mg IM Depo-Medrol to help with ear congestion and pain.  Patient has been referred to pulmonary for concern for obstructive sleep apnea as well as ENT for her ear pain and fluid in her ears.  Initial visit with pulmonary 08/09/2021-home-based sleep study ordered  Today presents for follow-up. She reports doing well today.  Her symptoms typically arise the most when it is going to rain or raining and therefore she stays indoors on rainy days.  Ironically it seems to be a rainy day on the past few times she has come to our office for visits. She reports continued popping and itching/irritation in her ears which has not resolved over the years despite numerous attempts at treatment.  She was referred to ENT, but never heard back regarding scheduling. She has been evaluated by pulmonary due to concerns for sleep apnea.  However, she is unsure if she is willing to pay to have a sleep study done.  Her snoring is not bothersome to her, only her family members. She reports that her asthma has been well controlled.  She has not needed her rescue inhaler at all since her last visit. She continues to avoid mollusks, but does eat crab legs and shrimp on occasion without symptoms.  She avoids cinnamon and nutmeg.  She has not had any accidental bee  stings.  ------------------------------------------------------------------------------------ Summary of prior history/diagnostics: Moderate persistent asthma without complication Past history - Patient was diagnosed with asthma over 10 years ago. Triggers are rain, allergies and pet exposure.  2022-2023- 3 courses of prednisone due to asthma exacerbations.  2020 -2022 spirometry was normal with no improvement in FEV1 post. bronchodilator treatment.   Previous treatments include Breo Allergic Rhinitis: SPT in 2011 was positive to grass, weed, trees, dust mites, cat, dog.  SPT 2013 testing was positive to cat, grass, dust mite, trees, ragweed, weed, cockroach, mold, horse, feather. Patient was on allergy injection for a few years but stopped due to localized reactions.  2020 bloodwork only positive to dust mites.  Isaiah Serge was attempted but she stopped due to oral pruritus.  She reported a pruritic rash after third dose as well.  Drug challenge on 08/05/2019 was past and she was restarted on Odactra.  She did not tolerate the oral pruritus and stopped it again.  For medical therapy she has tried Product/process development scientist, Careers adviser, Zyrtec, Claritin, and Xyzal, xhance, Pataday, Lumify.  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  Patient reports strict avoidance of mollusca scallops, cinnamon, nutmeg.  She eats crustaceans without symptoms. -specific IgE negative to alpha gal, scallop, oyster, lamb, lobster, beef, pork, shrimp, crab, - 2022 cinnamon, nutmeg negative  Penicillin allergy: Broke out in hives at the age of 35 and no penicillin type antibiotics since then.  She has since avoided penicillin derivatives.  Bee sting allergy: Reactions as a child with shortness of  breath and lip swelling requiring ER visit.  2020 hymenoptera panel was negative.  She reports no field stings.    Allergies as of 09/20/2021       Reactions   Cinnamon Anaphylaxis   Nutmeg Oil (myristica  Oil) Anaphylaxis   Shellfish Allergy Itching   Eyes watering, rhinorrhea. Report from [facility]   Doxycycline Nausea And Vomiting   Penicillins Hives, Itching   Fever, chills   Prednisone Itching        Medication List        Accurate as of September 20, 2021 12:19 PM. If you have any questions, ask your nurse or doctor.          STOP taking these medications    BIOTIN 5000 PO Stopped by: [name], MD       TAKE these medications    albuterol (2.5 MG/3ML) 0.083% nebulizer solution Commonly known as: PROVENTIL albuterol sulfate 2.5 mg/3 mL (0.083 %) solution for nebulization   albuterol 108 (90 Base) MCG/ACT inhaler Commonly known as: VENTOLIN HFA Inhale 2 puffs into the lungs every 6 (six) hours as needed. Shortness of breath and wheezing   azelastine 0.1 % nasal spray Commonly known as: ASTELIN Place 2 sprays into both nostrils 2 (two) times daily as needed for rhinitis. Use in each nostril as directed Started by: [name], MD   COLLAGEN PO Take by mouth.   EPINEPHrine 0.3 mg/0.3 mL Soaj injection Commonly known as: Auvi-Q Inject 0.3 mg into the muscle as needed for anaphylaxis.   fluticasone furoate-vilanterol 100-25 MCG/ACT Aepb Commonly known as: Breo Ellipta Inhale 1 puff into the lungs daily. What changed:  medication strength when to take this Changed by: [name], MD   ipratropium 0.03 % nasal spray Commonly known as: ATROVENT Place 2 sprays into both nostrils every 12 (twelve) hours.   levocetirizine 5 MG tablet Commonly known as: XYZAL Take 1 tablet (5 mg total) by mouth daily.   meclizine 25 MG tablet Commonly known as: ANTIVERT Take 25 mg by mouth 3 (three) times daily as needed.   medroxyPROGESTERone 150 MG/ML injection Commonly known as: DEPO-PROVERA Depo-Provera 150 mg/mL intramuscular suspension  Inject 1 mL every 3 months by intramuscular route.   montelukast 10 MG tablet Commonly known as:  Singulair Take 1 tablet (10 mg total) by mouth at bedtime.   MULTIVITAMIN PO Take by mouth.   Olopatadine HCl 0.2 % Soln Commonly known as: Pataday Place 1 drop in each eye once a day as needed for itchy watery eyes.  Wait 15 minutes before placing contacts after using Pataday   ondansetron 8 MG disintegrating tablet Commonly known as: ZOFRAN-ODT Take 8 mg by mouth 3 (three) times daily as needed.   scopolamine 1 MG/3DAYS Commonly known as: TRANSDERM-SCOP Transderm-Scop 1.5 mg transdermal patch (1 mg over 3 days)  UNWRAP AND APPLY 1 PATCH TO SKIN EVERY THIRD DAY   sertraline 50 MG tablet Commonly known as: ZOLOFT Take 50 mg by mouth daily.   tolterodine 4 MG 24 hr capsule Commonly known as: DETROL LA Take 4 mg by mouth daily.   Xhance 93 MCG/ACT Exhu Generic drug: Fluticasone Propionate Place 2 sprays into the nose 2 (two) times daily as needed. What changed:  when to take this reasons to take this Changed by: [name], MD       Past Medical History:  Diagnosis Date   Asthma    Seasonal allergies  Past Surgical History:  Procedure Laterality Date   ECTOPIC PREGNANCY SURGERY     Otherwise, there have been no changes to her past medical history, surgical history, family history, or social history.  ROS: All others negative except as noted per HPI.   Objective:  BP 100/60   Pulse 88   Temp (!) 97.2 F (36.2 C) (Temporal)   Resp 16   SpO2 100%  There is no height or weight on file to calculate BMI. Physical Exam: General Appearance:  Alert, cooperative, no distress, appears stated age  Head:  Normocephalic, without obvious abnormality, atraumatic  Eyes:  Conjunctiva clear, EOM's intact  Nose: Nares normal, normal mucosa, no visible anterior polyps, and septum midline  Throat: Lips, tongue normal; teeth and gums normal, normal posterior oropharynx  Neck: Supple, symmetrical  Lungs:   clear to auscultation bilaterally, Respirations unlabored, no  coughing  Heart:  regular rate and rhythm and no murmur, Appears well perfused  Extremities: No edema  Skin: Skin color, texture, turgor normal, no rashes or lesions on visualized portions of skin  Neurologic: No gross deficits   Assessment/Plan   Perennial allergic rhinoconjunctivitis with Eustachian tube dysfunction: May use saline nasal gel to help with nasal dryness Continue Xhance 2 sprays per nostril twice a day as needed for stuffy nose May use saline nasal rinse or saline nasal spray as needed for nasal symptoms. Use this prior to any medicated nasal sprays Continue Singulair 10mg  daily at night. Continue Xyzal (levocetirizine) 1 tablet once a day.  On days that your allergies are bad you may try taking 1 tablet twice a day.  This may help with current itching Start Astelin (azelastine) 2 sprays in each nostril twice a day as needed-will help with itchy mouth and ears as well as nasal congestion  Please call ENT - referral placed in March 2023 [address] [place], [place] Ph: [phone] Fax: [phone]     Asthma:   Continue Breo 100 mcg 1 puff daily and rinse mouth afterwards.  May use albuterol 2 puffs every 4 hours as needed for cough, wheeze, tightness in chest or shortness of breath. Also, may use albuterol 2 puffs 5-15 minutes prior to exercise.   Asthma control goals:  Full participation in all desired activities (may need albuterol before activity) Albuterol use two times or less a week on average (not counting use with activity) Cough interfering with sleep two times or less a month Oral steroids no more than once a year No hospitalizations   Allergy with anaphylaxis due to food Avoid mollusks (scallops, oysters, clams, calamari), cinnamon and nut meg. In case of an allergic reaction, give Benadryl 4 teaspoonfuls every 4 hours, and if life-threatening symptoms occur, inject with EpiPen 0.3 mg.   If interested we can schedule food  challenges to cinnamon and nutmeg. Must be done on separate occasions. You must be off antihistamines for 3-5 days before. Plan on being in the office for 2-3 hours and must bring in the food you want to do the oral challenge for. You must call to scheduled an appointment and specify it's for a food challenge.    Bee sting allergy Avoid bee stings. In case of an allergic reaction, give Benadryl 4 teaspoonfuls every 4 hours, and if life-threatening symptoms occur, inject with EpiPen 0.3 mg.   History of penicillin allergy Continue avoidance and consider penicillin testing in future.   Epistaxis  Pinch both nostrils while leaning forward for at  least 5 minutes before checking to see if the bleeding has stopped. If bleeding is not controlled within 5-10 minutes apply a cotton ball soaked with oxymetazoline (Afrin) to the bleeding nostril for a few seconds.  If the problem persists or worsens a referral to ENT for further evaluation may be necessary.  Concern for Sleep Apnea  -Continue follow-up with pulmonary  Follow up in 6 months, sooner if needed.   It was a pleasure seeing you again today!   Tonny Bollman, MD  [facility]

## 2021-09-20 ENCOUNTER — Ambulatory Visit: Payer: 59 | Admitting: Internal Medicine

## 2021-09-20 ENCOUNTER — Encounter: Payer: Self-pay | Admitting: Internal Medicine

## 2021-09-20 VITALS — BP 100/60 | HR 88 | Temp 97.2°F | Resp 16

## 2021-09-20 DIAGNOSIS — J3089 Other allergic rhinitis: Secondary | ICD-10-CM | POA: Diagnosis not present

## 2021-09-20 DIAGNOSIS — T7800XD Anaphylactic reaction due to unspecified food, subsequent encounter: Secondary | ICD-10-CM

## 2021-09-20 DIAGNOSIS — H1013 Acute atopic conjunctivitis, bilateral: Secondary | ICD-10-CM

## 2021-09-20 DIAGNOSIS — J4540 Moderate persistent asthma, uncomplicated: Secondary | ICD-10-CM

## 2021-09-20 DIAGNOSIS — Z880 Allergy status to penicillin: Secondary | ICD-10-CM

## 2021-09-20 DIAGNOSIS — Z91030 Bee allergy status: Secondary | ICD-10-CM | POA: Diagnosis not present

## 2021-09-20 MED ORDER — LEVOCETIRIZINE DIHYDROCHLORIDE 5 MG PO TABS
5.0000 mg | ORAL_TABLET | Freq: Every day | ORAL | 5 refills | Status: DC
Start: 2021-09-20 — End: 2022-03-28

## 2021-09-20 MED ORDER — MONTELUKAST SODIUM 10 MG PO TABS
10.0000 mg | ORAL_TABLET | Freq: Every day | ORAL | 5 refills | Status: DC
Start: 2021-09-20 — End: 2022-03-28

## 2021-09-20 MED ORDER — XHANCE 93 MCG/ACT NA EXHU: 2.0000 | INHALANT_SUSPENSION | Freq: Two times a day (BID) | NASAL | 6 refills | Status: DC | PRN

## 2021-09-20 MED ORDER — FLUTICASONE FUROATE-VILANTEROL 100-25 MCG/ACT IN AEPB
1.0000 | INHALATION_SPRAY | Freq: Every day | RESPIRATORY_TRACT | 5 refills | Status: DC
Start: 2021-09-20 — End: 2022-03-28

## 2021-09-20 MED ORDER — ALBUTEROL SULFATE HFA 108 (90 BASE) MCG/ACT IN AERS: 2.0000 | INHALATION_SPRAY | Freq: Four times a day (QID) | RESPIRATORY_TRACT | 1 refills | Status: DC | PRN

## 2021-09-20 MED ORDER — AZELASTINE HCL 0.1 % NA SOLN: 2.0000 | Freq: Two times a day (BID) | NASAL | 5 refills | Status: DC | PRN

## 2021-09-20 MED ORDER — EPINEPHRINE 0.3 MG/0.3ML IJ SOAJ: 0.3000 mg | INTRAMUSCULAR | 1 refills | Status: DC | PRN

## 2021-09-20 NOTE — Patient Instructions (Addendum)
Perennial allergic rhinoconjunctivitis with Eustachian tube dysfunction: May use saline nasal gel to help with nasal dryness Continue Xhance 2 sprays per nostril twice a day as needed for stuffy nose May use saline nasal rinse or saline nasal spray as needed for nasal symptoms. Use this prior to any medicated nasal sprays Continue Singulair 10mg  daily at night. Continue Xyzal (levocetirizine) 1 tablet once a day.  On days that your allergies are bad you may try taking 1 tablet twice a day.  This may help with current itching Start Astelin (azelastine) 2 sprays in each nostril twice a day as needed-will help with itchy mouth and ears as well as nasal congestion  Please call ENT - referral placed in March 2023 [address] [place], [place] Ph: [phone] Fax: [phone]     Asthma:   Continue Breo 100 mcg 1 puff daily and rinse mouth afterwards.  May use albuterol 2 puffs every 4 hours as needed for cough, wheeze, tightness in chest or shortness of breath. Also, may use albuterol 2 puffs 5-15 minutes prior to exercise.   Asthma control goals:  Full participation in all desired activities (may need albuterol before activity) Albuterol use two times or less a week on average (not counting use with activity) Cough interfering with sleep two times or less a month Oral steroids no more than once a year No hospitalizations   Allergy with anaphylaxis due to food Avoid mollusks (scallops, oysters, clams, calamari), cinnamon and nut meg. In case of an allergic reaction, give Benadryl 4 teaspoonfuls every 4 hours, and if life-threatening symptoms occur, inject with EpiPen 0.3 mg.   If interested we can schedule food challenges to cinnamon and nutmeg. Must be done on separate occasions. You must be off antihistamines for 3-5 days before. Plan on being in the office for 2-3 hours and must bring in the food you want to do the oral challenge for. You must call to scheduled an  appointment and specify it's for a food challenge.    Bee sting allergy Avoid bee stings. In case of an allergic reaction, give Benadryl 4 teaspoonfuls every 4 hours, and if life-threatening symptoms occur, inject with EpiPen 0.3 mg.   History of penicillin allergy Continue avoidance and consider penicillin testing in future.   Epistaxis  Pinch both nostrils while leaning forward for at least 5 minutes before checking to see if the bleeding has stopped. If bleeding is not controlled within 5-10 minutes apply a cotton ball soaked with oxymetazoline (Afrin) to the bleeding nostril for a few seconds.  If the problem persists or worsens a referral to ENT for further evaluation may be necessary.  Concern for Sleep Apnea  -Continue follow-up with pulmonary  Follow up in 6 months, sooner if needed.   It was a pleasure seeing you again today!

## 2021-09-20 NOTE — Progress Notes (Signed)
Formatting of this note is different from the original.  Images from the original note were not included.      FOLLOW UP  Date of Service/Encounter:  09/20/21    Subjective:   Vanessa Giles (DOB: 1985-08-31) is a 36 y.o. female who [facility] in re-evaluation of the following: allergic rhinitis, persistent asthma, stinging insect allergy, penicillin allergy, food allergy  History obtained from: chart review and patient.    For Review, LV was on 05/31/21  with Dr.Dennis seen for routine follow-up.  At that visit she continued to complain of clicking and popping in her ears.  Using Xhance 2 sprays twice daily.  On Singulair and Xyzal.  We did give her a dose of 40 mg IM Depo-Medrol to help with ear congestion and pain.    Patient has been referred to pulmonary for concern for obstructive sleep apnea as well as ENT for her ear pain and fluid in her ears.    Initial visit with pulmonary 08/09/2021-home-based sleep study ordered    Today presents for follow-up.  She reports doing well today.  Her symptoms typically arise the most when it is going to rain or raining and therefore she stays indoors on rainy days.  Ironically it seems to be a rainy day on the past few times she has come to our office for visits.  She reports continued popping and itching/irritation in her ears which has not resolved over the years despite numerous attempts at treatment.  She was referred to ENT, but never heard back regarding scheduling.  She has been evaluated by pulmonary due to concerns for sleep apnea.  However, she is unsure if she is willing to pay to have a sleep study done.  Her snoring is not bothersome to her, only her family members.  She reports that her asthma has been well controlled.  She has not needed her rescue inhaler at all since her last visit.  She continues to avoid mollusks, but does eat crab legs and shrimp on occasion without symptoms.  She avoids cinnamon and nutmeg.   She has not had any accidental bee stings.    ------------------------------------------------------------------------------------  Summary of prior history/diagnostics:  Moderate persistent asthma without complication  Past history - Patient was diagnosed with asthma over 10 years ago. Triggers are rain, allergies and pet exposure.  2022-2023- 3 courses of prednisone due to asthma exacerbations.   2020 -2022 spirometry was normal with no improvement in FEV1 post. bronchodilator treatment.    Previous treatments include Breo  Allergic Rhinitis:  SPT in 2011 was positive to grass, weed, trees, dust mites, cat, dog.  SPT 2013 testing was positive to cat, grass, dust mite, trees, ragweed, weed, cockroach, mold, horse, feather. Patient was on allergy injection for a few years but stopped due to localized reactions.   2020 bloodwork only positive to dust mites.  Isaiah Serge was attempted but she stopped due to oral pruritus.  She reported a pruritic rash after third dose as well.  Drug challenge on 08/05/2019 was past and she was restarted on Odactra.  She did not tolerate the oral pruritus and stopped it again.   For medical therapy she has tried Product/process development scientist, Careers adviser, Zyrtec, Claritin, and Xyzal, xhance, Pataday, Lumify.   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  Patient reports strict avoidance of mollusca scallops, cinnamon, nutmeg.  She eats crustaceans without symptoms.  -specific IgE negative to  alpha gal, scallop, oyster, lamb, lobster, beef, pork, shrimp, crab,  - 2022 cinnamon, nutmeg negative   Penicillin allergy: Broke out in hives at the age of 70 and no penicillin type antibiotics since then.  She has since avoided penicillin derivatives.   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  2020 hymenoptera panel was negative.  She reports no field stings.      Allergies as of 09/20/2021         Reactions    Cinnamon  Anaphylaxis    Nutmeg Oil (myristica Oil) Anaphylaxis    Shellfish Allergy Itching    Eyes watering, rhinorrhea. Report from [facility]    Doxycycline Nausea And Vomiting    Penicillins Hives, Itching    Fever, chills    Prednisone Itching           Medication List          Accurate as of September 20, 2021 12:19 PM. If you have any questions, ask your nurse or doctor.             STOP taking these medications      BIOTIN 5000 PO  Stopped by: [name], MD          TAKE these medications      albuterol (2.5 MG/3ML) 0.083% nebulizer solution  Commonly known as: PROVENTIL  albuterol sulfate 2.5 mg/3 mL (0.083 %) solution for nebulization    albuterol 108 (90 Base) MCG/ACT inhaler  Commonly known as: VENTOLIN HFA  Inhale 2 puffs into the lungs every 6 (six) hours as needed. Shortness of breath and wheezing    azelastine 0.1 % nasal spray  Commonly known as: ASTELIN  Place 2 sprays into both nostrils 2 (two) times daily as needed for rhinitis. Use in each nostril as directed  Started by: [name], MD    COLLAGEN PO  Take by mouth.    EPINEPHrine 0.3 mg/0.3 mL Soaj injection  Commonly known as: Auvi-Q  Inject 0.3 mg into the muscle as needed for anaphylaxis.    fluticasone furoate-vilanterol 100-25 MCG/ACT Aepb  Commonly known as: Breo Ellipta  Inhale 1 puff into the lungs daily.  What changed:   medication strength  when to take this  Changed by: [name], MD    ipratropium 0.03 % nasal spray  Commonly known as: ATROVENT  Place 2 sprays into both nostrils every 12 (twelve) hours.    levocetirizine 5 MG tablet  Commonly known as: XYZAL  Take 1 tablet (5 mg total) by mouth daily.    meclizine 25 MG tablet  Commonly known as: ANTIVERT  Take 25 mg by mouth 3 (three) times daily as needed.    medroxyPROGESTERone 150 MG/ML injection  Commonly known as: DEPO-PROVERA  Depo-Provera 150 mg/mL intramuscular suspension   Inject 1 mL every 3 months by intramuscular route.    montelukast 10 MG tablet  Commonly  known as: Singulair  Take 1 tablet (10 mg total) by mouth at bedtime.    MULTIVITAMIN PO  Take by mouth.    Olopatadine HCl 0.2 % Soln  Commonly known as: Pataday  Place 1 drop in each eye once a day as needed for itchy watery eyes.  Wait 15 minutes before placing contacts after using Pataday    ondansetron 8 MG disintegrating tablet  Commonly known as: ZOFRAN-ODT  Take 8 mg by mouth 3 (three) times daily as  needed.    scopolamine 1 MG/3DAYS  Commonly known as: TRANSDERM-SCOP  Transderm-Scop 1.5 mg transdermal patch (1 mg over 3 days)   UNWRAP AND APPLY 1 PATCH TO SKIN EVERY THIRD DAY    sertraline 50 MG tablet  Commonly known as: ZOLOFT  Take 50 mg by mouth daily.    tolterodine 4 MG 24 hr capsule  Commonly known as: DETROL LA  Take 4 mg by mouth daily.    Xhance 93 MCG/ACT Exhu  Generic drug: Fluticasone Propionate  Place 2 sprays into the nose 2 (two) times daily as needed.  What changed:   when to take this  reasons to take this  Changed by: [name], MD          Past Medical History:   Diagnosis Date    Asthma     Seasonal allergies      Past Surgical History:   Procedure Laterality Date    ECTOPIC PREGNANCY SURGERY       Otherwise, there have been no changes to her past medical history, surgical history, family history, or social history.    ROS: All others negative except as noted per HPI.     Objective:   BP 100/60   Pulse 88   Temp (!) 97.2 F (36.2 C) (Temporal)   Resp 16   SpO2 100%   There is no height or weight on file to calculate BMI.  Physical Exam:  General Appearance:  Alert, cooperative, no distress, appears stated age   Head:  Normocephalic, without obvious abnormality, atraumatic   Eyes:  Conjunctiva clear, EOM's intact   Nose: Nares normal, normal mucosa, no visible anterior polyps, and septum midline   Throat: Lips, tongue normal; teeth and gums normal, normal posterior oropharynx   Neck: Supple, symmetrical   Lungs:   clear to auscultation bilaterally, Respirations unlabored, no  coughing   Heart:  regular rate and rhythm and no murmur, Appears well perfused   Extremities: No edema   Skin: Skin color, texture, turgor normal, no rashes or lesions on visualized portions of skin   Neurologic: No gross deficits     Assessment/Plan     Perennial allergic rhinoconjunctivitis with Eustachian tube dysfunction:  May use saline nasal gel to help with nasal dryness  Continue Xhance 2 sprays per nostril twice a day as needed for stuffy nose  May use saline nasal rinse or saline nasal spray as needed for nasal symptoms. Use this prior to any medicated nasal sprays  Continue Singulair 10mg  daily at night.  Continue Xyzal (levocetirizine) 1 tablet once a day.  On days that your allergies are bad you may try taking 1 tablet twice a day.  This may help with current itching  Start Astelin (azelastine) 2 sprays in each nostril twice a day as needed-will help with itchy mouth and ears as well as nasal congestion    Please call ENT - referral placed in March 2023  [address]  [address]  [place], [place]  Ph: [phone]  Fax: [phone]      Asthma:    Continue Breo 100 mcg 1 puff daily and rinse mouth afterwards.   May use albuterol 2 puffs every 4 hours as needed for cough, wheeze, tightness in chest or shortness of breath. Also, may use albuterol 2 puffs 5-15 minutes prior to exercise.    Asthma control goals:   Full participation in all desired activities (may need albuterol before activity)  Albuterol use  two times or less a week on average (not counting use with activity)  Cough interfering with sleep two times or less a month  Oral steroids no more than once a year  No hospitalizations    Allergy with anaphylaxis due to food  Avoid mollusks (scallops, oysters, clams, calamari), cinnamon and nut meg. In case of an allergic reaction, give Benadryl 4 teaspoonfuls every 4 hours, and if life-threatening symptoms occur, inject with EpiPen 0.3 mg.    If interested we can schedule food challenges to  cinnamon and nutmeg. Must be done on separate occasions. You must be off antihistamines for 3-5 days before. Plan on being in the office for 2-3 hours and must bring in the food you want to do the oral challenge for. You must call to scheduled an appointment and specify it's for a food challenge.     Bee sting allergy  Avoid bee stings. In case of an allergic reaction, give Benadryl 4 teaspoonfuls every 4 hours, and if life-threatening symptoms occur, inject with EpiPen 0.3 mg.    History of penicillin allergy  Continue avoidance and consider penicillin testing in future.    Epistaxis   Pinch both nostrils while leaning forward for at least 5 minutes before checking to see if the bleeding has stopped. If bleeding is not controlled within 5-10 minutes apply a cotton ball soaked with oxymetazoline (Afrin) to the bleeding nostril for a few seconds.   If the problem persists or worsens a referral to ENT for further evaluation may be necessary.    Concern for Sleep Apnea   -Continue follow-up with pulmonary    Follow up in 6 months, sooner if needed.     It was a pleasure seeing you again today!    Tonny Bollman, MD   [facility]      Electronically signed by [name], MD at 09/20/2021 12:20 PM EDT

## 2021-10-04 DIAGNOSIS — F332 Major depressive disorder, recurrent severe without psychotic features: Secondary | ICD-10-CM | POA: Diagnosis not present

## 2021-10-04 DIAGNOSIS — R69 Illness, unspecified: Secondary | ICD-10-CM | POA: Diagnosis not present

## 2021-10-04 DIAGNOSIS — Z79899 Other long term (current) drug therapy: Secondary | ICD-10-CM | POA: Diagnosis not present

## 2021-10-11 DIAGNOSIS — R69 Illness, unspecified: Secondary | ICD-10-CM | POA: Diagnosis not present

## 2021-10-11 DIAGNOSIS — R895 Abnormal microbiological findings in specimens from other organs, systems and tissues: Secondary | ICD-10-CM | POA: Diagnosis not present

## 2021-10-11 DIAGNOSIS — R110 Nausea: Secondary | ICD-10-CM | POA: Diagnosis not present

## 2021-10-11 DIAGNOSIS — Z113 Encounter for screening for infections with a predominantly sexual mode of transmission: Secondary | ICD-10-CM | POA: Diagnosis not present

## 2021-10-11 DIAGNOSIS — Z124 Encounter for screening for malignant neoplasm of cervix: Secondary | ICD-10-CM | POA: Diagnosis not present

## 2021-10-11 DIAGNOSIS — Z6825 Body mass index (BMI) 25.0-25.9, adult: Secondary | ICD-10-CM | POA: Diagnosis not present

## 2021-10-11 DIAGNOSIS — Z01419 Encounter for gynecological examination (general) (routine) without abnormal findings: Secondary | ICD-10-CM | POA: Diagnosis not present

## 2021-10-25 DIAGNOSIS — Z3042 Encounter for surveillance of injectable contraceptive: Secondary | ICD-10-CM | POA: Diagnosis not present

## 2021-10-28 DIAGNOSIS — M1811 Unilateral primary osteoarthritis of first carpometacarpal joint, right hand: Secondary | ICD-10-CM | POA: Diagnosis not present

## 2021-10-28 DIAGNOSIS — M79644 Pain in right finger(s): Secondary | ICD-10-CM | POA: Diagnosis not present

## 2021-11-08 DIAGNOSIS — R69 Illness, unspecified: Secondary | ICD-10-CM | POA: Diagnosis not present

## 2022-01-03 DIAGNOSIS — E782 Mixed hyperlipidemia: Secondary | ICD-10-CM | POA: Diagnosis not present

## 2022-01-03 DIAGNOSIS — I10 Essential (primary) hypertension: Secondary | ICD-10-CM | POA: Diagnosis not present

## 2022-01-10 DIAGNOSIS — M25541 Pain in joints of right hand: Secondary | ICD-10-CM | POA: Diagnosis not present

## 2022-01-10 DIAGNOSIS — H9393 Unspecified disorder of ear, bilateral: Secondary | ICD-10-CM | POA: Diagnosis not present

## 2022-01-10 DIAGNOSIS — E876 Hypokalemia: Secondary | ICD-10-CM | POA: Diagnosis not present

## 2022-01-10 DIAGNOSIS — Z0000 Encounter for general adult medical examination without abnormal findings: Secondary | ICD-10-CM | POA: Diagnosis not present

## 2022-01-10 DIAGNOSIS — I10 Essential (primary) hypertension: Secondary | ICD-10-CM | POA: Diagnosis not present

## 2022-01-10 DIAGNOSIS — Z3042 Encounter for surveillance of injectable contraceptive: Secondary | ICD-10-CM | POA: Diagnosis not present

## 2022-01-10 DIAGNOSIS — B372 Candidiasis of skin and nail: Secondary | ICD-10-CM | POA: Diagnosis not present

## 2022-01-10 DIAGNOSIS — Z1339 Encounter for screening examination for other mental health and behavioral disorders: Secondary | ICD-10-CM | POA: Diagnosis not present

## 2022-01-10 DIAGNOSIS — G43009 Migraine without aura, not intractable, without status migrainosus: Secondary | ICD-10-CM | POA: Diagnosis not present

## 2022-01-10 DIAGNOSIS — M25542 Pain in joints of left hand: Secondary | ICD-10-CM | POA: Diagnosis not present

## 2022-01-10 NOTE — Progress Notes (Signed)
Formatting of this note is different from the original.  Vanessa Giles    Annual Physical     Name: Vanessa Giles  DOB: June 17, 1985   Visit Date: 01/10/2022   Encounter Provider: Faylene Kurtz, FNP    Chief Complaint   Patient presents with   ? Annual Exam     Subjective:    Vanessa Giles is a 36 y.o. female who presents today for Annual Physical.    Last Pap Smear: 07/2021    History of Abnormal Pap: Yes    Menstrual cycle: None; currently on Depo     Last Menstrual Period: Depo Provera    Children: 2    History of Cancer: No    Family History of Cancer: None     Sexually Active: Yes    Birth Control: Depo Provera    Gynecological Problems: None    Last Mammogram: 06/2018. No complaints of breast lumps, bumps or masses, breast pain, breast discharge, inverted nipples, or skin changes on breasts.     Last Colonoscopy: N/A    Last Skin Exam: Self-Exam    Since her last office visit she is currently following with Dr. Donata Giles in Psychiatry. She also following up with Allergist. Denies reports of anxiety and depression today. She is taking all medications as prescribed. She is currently getting physical activity daily and adapting healthier meals. She denies fever, fatigue, night sweats, unexplained weight loss, headaches, heart palpitations, dizziness, chest pain, cough, shortness of breath, nausea, vomiting, diarrhea, and constipation today. She reports pain and swelling in both thumb today.  She states that she has trouble focusing and performing tasks at work.     Review of Systems   Constitutional: Negative.    HENT: Negative.    Eyes: Negative.    Respiratory: Negative.    Cardiovascular: Negative.    Gastrointestinal: Negative.    Genitourinary: Negative.    Musculoskeletal: Negative.    Skin: Negative.    Neurological: Negative.    Endo/Heme/Allergies: Negative.    Psychiatric/Behavioral: Negative.        PAST MEDICAL HISTORY    Past Medical History:    Diagnosis Date   ? Abnormal Pap smear of cervix    ? Allergy    ? Asthma      SURGICAL HISTORY    Past Surgical History:   Procedure Laterality Date   ? DILATION AND CURETTAGE OF UTERUS      age 4   ? NO PAST SURGERIES       CURRENT MEDICATIONS      Current Outpatient Medications:   ?  albuterol 2.5 mg /3 mL (0.083 %) nebulizer solution, albuterol sulfate 2.5 mg/3 mL (0.083 %) solution for nebulization, Disp: , Rfl:   ?  buPROPion XL (WELLBUTRIN XL) 300 MG 24 hr tablet, Take 1 a day =300 mg., Disp: 30 tablet, Rfl: 3  ?  diclofenac (VOLTAREN) 75 MG EC tablet, Take 1 tablet (75 mg total) by mouth 2 times daily., Disp: 20 tablet, Rfl: 0  ?  EPINEPHrine (EPI-PEN) 0.3 mg/0.3 mL auto-injector,  , Disp: , Rfl:   ?  fluticasone furoate-vilanteroL (BREO ELLIPTA) 100-25 mcg/dose inhaler, Breo Ellipta 100 mcg-25 mcg/dose powder for inhalation  INHALE ONE PUFF BY MOUTH ONE TIME DAILY, Disp: , Rfl:   ?  hydrOXYzine HCL (ATARAX) 10 MG tablet, Take 1 tablet (10 mg total) by mouth 3 (three)  times daily as needed for Anxiety., Disp: 60  tablet, Rfl: 0  ?  ibuprofen (ADVIL,MOTRIN) 800 MG tablet, Take 1 tablet (800 mg total) by mouth 2 (two)  times daily as needed for Headaches., Disp: 60 tablet, Rfl: 2  ?  ipratropium (ATROVENT) 21 mcg (0.03 %) nasal spray, Administer 2 sprays into affected nostril every 12 hours., Disp: , Rfl:   ?  levocetirizine (XYZAL) 5 MG tablet, Take 1 tablet (5 mg total) by mouth daily., Disp: , Rfl:   ?  meclizine (ANTIVERT) 25 mg tablet, Take 1 tablet (25 mg total) by mouth 3 (three)  times daily as needed., Disp: 60 tablet, Rfl: 5  ?  medroxyPROGESTERone (DEPO-PROVERA) 150 mg/mL injection,  , Disp: , Rfl:   ?  methylphenidate HCl (METADATE ER) 10 MG ER tablet, Take 1 tablet (10 mg total) by mouth every morning., Disp: 30 tablet, Rfl: 0  ?  montelukast (SINGULAIR) 10 mg tablet,  , Disp: , Rfl:   ?  ondansetron (ZOFRAN-ODT) 4 MG disintegrating tablet, DISSOLVE ONE TABLET ON THE TONGUE EVERY 8 HOURS FOR UP  TO 7 DAYS AS NEEDED FOR NAUSEA, Disp: 20 tablet, Rfl: 1  ?  scopolamine (TRANSDERM-SCOP) 1 mg over 3 days patch, Place 1 patch onto the skin every third day. Note: 1.5 mg patch delivers 1 mg over 3 days, Disp: 10 patch, Rfl: 3  ?  tolterodine (DETROL LA) 4 MG 24 hr capsule, Take 1 capsule (4 mg total) by mouth daily., Disp: 30 capsule, Rfl: 5  ?  XHANCE 93 mcg/actuation AerB nasal spray, Administer 2 sprays in each nostril daily., Disp: , Rfl:   ?  nystatin (MYCOSTATIN) 100,000 unit/gram powder, Apply to affected areas 4 times a day as needed., Disp: 60 g, Rfl: 1  ?  potassium chloride 20 mEq/15 mL solution, Take 15 mLs (20 mEq total) by mouth daily., Disp: 500 mL, Rfl: 1    ALLERGIES    Allergies   Allergen Reactions   ? Cinnamon Anaphylaxis (ALLERGY)   ? Nutmeg Anaphylaxis (ALLERGY)   ? Penicillins GI Upset (intolerance) and Urticaria / Hives (ALLERGY)   ? Doxycycline Nausea & Vomiting (ALLERGY/intolerance)   ? Shellfish Containing Products Itching / Pruritis (ALLERGY/intolerance)     Eyes watering, rhinorrhea. Report from [facility]   ? Prednisone Itching / Pruritis (ALLERGY/intolerance)     FAMILY HISTORY    Family History   Problem Relation Age of Onset   ? Diverticulitis Mother    ? Hypertension Father    ? Hyperlipidemia Father      SOCIAL HISTORY    Social History     Socioeconomic History   ? Marital status: Divorced     Spouse name: Not on file   ? Number of children: Not on file   ? Years of education: Not on file   ? Highest education level: Not on file   Occupational History   ? Not on file   Tobacco Use   ? Smoking status: Former     Packs/day: .5     Types: Cigarettes   ? Smokeless tobacco: Never   ? Tobacco comments:     quti x 1 year   Vaping Use   ? Vaping Use: Former   Substance and Sexual Activity   ? Alcohol use: Yes     Comment: occasional   ? Drug use: No   ? Sexual activity: Yes     Partners: Male   Other Topics Concern   ? Not on file   Social  History Narrative   ? Not on file      Social Determinants of Health     Financial Resource Strain: Not on file   Food Insecurity: Not on file   Transportation Needs: Not on file   Physical Activity: Not on file   Stress: Not on file   Social Connections: Not on file   Housing Stability: Not on file     Social History     Tobacco Use   Smoking Status Former   ? Packs/day: .5   ? Types: Cigarettes   Smokeless Tobacco Never   Tobacco Comments    quti x 1 year     Social History     Substance and Sexual Activity   Alcohol Use Yes    Comment: occasional     Social History     Substance and Sexual Activity   Drug Use No     Objective:    Ms. Pechacek  height is 1.626 m ('5\' 4"' ) and weight is 72.9 kg (160 lb 12.8 oz). Her temporal temperature is 98 F (36.7 C). Her blood pressure is 116/82 and her pulse is 85. Her respiration is 18 and oxygen saturation is 97%.   Wt Readings from Last 3 Encounters:   01/10/22 72.9 kg (160 lb 12.8 oz)   10/28/21 69.4 kg (153 lb)   08/09/21 64.9 kg (143 lb)     BP Readings from Last 3 Encounters:   01/10/22 116/82   10/28/21 114/85   08/09/21 112/80     Body mass index is 27.6 kg/m.    Height: 162.6 cm ('5\' 4"' )    Physical Exam  Vitals and nursing note reviewed.   Constitutional:       Appearance: Normal appearance.   HENT:      Head: Normocephalic.      Right Ear: Tympanic membrane normal.      Left Ear: Tympanic membrane normal.      Nose: Nose normal.      Mouth/Throat:      Mouth: Mucous membranes are moist.      Pharynx: Oropharynx is clear.   Eyes:      Pupils: Pupils are equal, round, and reactive to light.   Cardiovascular:      Rate and Rhythm: Normal rate and regular rhythm.      Pulses: Normal pulses.      Heart sounds: Normal heart sounds.   Pulmonary:      Effort: Pulmonary effort is normal.      Breath sounds: Normal breath sounds.   Abdominal:      General: Bowel sounds are normal.      Palpations: Abdomen is soft.   Musculoskeletal:         General: Normal range of motion.      Cervical back: Normal range of  motion and neck supple.   Skin:     General: Skin is warm and dry.      Capillary Refill: Capillary refill takes less than 2 seconds.   Neurological:      General: No focal deficit present.      Mental Status: She is alert and oriented to person, place, and time.   Psychiatric:         Mood and Affect: Mood normal.         Behavior: Behavior normal.         Thought Content: Thought content normal.         Judgment: Judgment normal.  Assessment/Plan:    1. Encounter for annual physical exam  Physical exam normal for age. Tolerated well with no complaints. Basic neurological exam performed today with no deficits noted. No concerns with memory. She will perform monthly self breast exams. She will continue to get 30 minutes of cardio exercise daily, adapt a low-fat/low-sugar diet, increase vegetables, increase water intake daily, and reduce stress.    2. Essential hypertension  Blood pressure is stable at 116/82. Current regimen is effective. She will continue medications as prescribed. Refills sent to pharmacy today. She will continue to get at least 30 minutes of cardio exercise a day to maintain a healthy weight, increase vegetables, decrease sodium intake, increase water intake, continue a low-fat, low-sugar diet, adopt smoking cessation, and take all medications as prescribed. We will continue to monitor every 3-6 months.     3. Migraine without aura and without status migrainosus, not intractable  Occasional. Stable today. No signs or symptoms noted or reported today. Monitor.     4. Hypokalemia  - potassium chloride 20 mEq/15 mL solution; Take 15 mLs (20 mEq total) by mouth daily.  Dispense: 500 mL; Refill: 1    5. ADHD (attention deficit hyperactivity disorder) evaluation  No significant reports of dry mouth, nausea, decreased appetite, rapid heart beats, constipation, decreased libido, insomnia, weight loss, headaches, evening-edginess/irritability, dysphoria, erectile dysfunction, urinary hesitancy,  dizziness, sweating and psychosis.     6. Ear problem, bilateral  History of narrow ear canals. We will continue to monitor.    7. Joint pain in fingers of both hands  - Sedimentation Rate (ESR); Future  - 25(OH) Vitamin D Total; Future  - Sedimentation Rate (ESR)  - 25(OH) Vitamin D Total    8. Yeast dermatitis  We will initiate Nystatin powder. Monitor.   - nystatin (MYCOSTATIN) 100,000 unit/gram powder; Apply to affected areas 4 times a day as needed.  Dispense: 60 g; Refill: 1      Orders Placed This Encounter   Procedures   ? Sedimentation Rate (ESR)   ? 25(OH) Vitamin D Total     Orders Placed This Encounter   Medications   ? potassium chloride 20 mEq/15 mL solution     Sig: Take 15 mLs (20 mEq total) by mouth daily.     Dispense:  500 mL     Refill:  1     **Trouble swallowing pills**   ? nystatin (MYCOSTATIN) 100,000 unit/gram powder     Sig: Apply to affected areas 4 times a day as needed.     Dispense:  60 g     Refill:  1     Return in about 6 months (around 07/12/2022) for Chronic Issues and Labs Prior to OV.     Electronically signed by Faylene Kurtz, [place] 01/10/2022 8:56 AM    Kathe Becton, MSN, ANE, FNP-BC  [facility] @ [address]  [place] [address]  [phone] Office  [phone] Fax           Electronically signed by: Faylene Kurtz, [place]  01/12/22 2025    Electronically signed by Faylene Kurtz, FNP at 01/12/2022  8:25 PM EDT

## 2022-01-31 DIAGNOSIS — R69 Illness, unspecified: Secondary | ICD-10-CM | POA: Diagnosis not present

## 2022-01-31 NOTE — Progress Notes (Signed)
Formatting of this note is different from the original.  Name: Vanessa Giles  Date: 01/31/2022  MRN: [id number]  DOB: Jul 31, 1985    Chief complaints- Medication monitoring    Subjective-    Patient is at her baseline.  Tells me that her depression and anxiety are fully resolved as she separated from her husband.  She is enjoying living with her 2 sons.  She stopped taking the Wellbutrin.  Denies risk to self or others.  No psychosis noted.  Continues to have some issues with focus at work.  Gradually improving.  Was never diagnosed with ADHD growing up.    INITIAL CONSULT    HPI: This is a 36 years old who is having significant anxiety and depression over her current job situation.  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  She has been given different duties and that is affecting her focus.  She tells me that she has had depression and anxiety on and off since her mom passed away.  She endorses symptoms of depression including low energy, motivation, anhedonia, isolation, irritability and anger.  Lasting for weeks to month ongoing since November.  This happened due to job change, stressful duties and work.   Does endorse generalized anxiety.  Seems to have PTSD from past events.  No psychosis reported.  Denies risk to self or others.  Denies past attempts.  Tells me that her 2 kids keep her going.  She was prescribed methylphenidate for some time but that made her more angry.  She has not had issues with focus in the past.  This seems more in relationship to the stress and frustration.  She is future oriented.  She is willing to try some low-dose Lexapro to see if it will help with the depression and anxiety.    PMH  Patient Active Problem List   Diagnosis   ? Vertigo   ? Chronic pain of right knee   ? Nonintractable migraine   ? Enlarged tonsils   ? Cigarette smoker   ? Allergic rhinitis   ? Moderate asthma without complication   ? Bronchitis   ? Mixed  stress and urge urinary incontinence   ? Attention deficit hyperactivity disorder (ADHD), combined type   ? Uses Depo-Provera as primary birth control method   ? Uncomplicated asthma   ? OSA (obstructive sleep apnea)   ? Snoring     Allergies   Allergen Reactions   ? Cinnamon Anaphylaxis (ALLERGY)   ? Nutmeg Anaphylaxis (ALLERGY)   ? Penicillins GI Upset (intolerance) and Urticaria / Hives (ALLERGY)   ? Doxycycline Nausea & Vomiting (ALLERGY/intolerance)   ? Shellfish Containing Products Itching / Pruritis (ALLERGY/intolerance)     Eyes watering, rhinorrhea. Report from [facility]   ? Prednisone Itching / Pruritis (ALLERGY/intolerance)     MEDS  Current Outpatient Medications   Medication Sig Dispense Refill   ? albuterol 2.5 mg /3 mL (0.083 %) nebulizer solution albuterol sulfate 2.5 mg/3 mL (0.083 %) solution for nebulization     ? buPROPion XL (WELLBUTRIN XL) 300 MG 24 hr tablet Take 1 a day =300 mg. 30 tablet 3   ? diclofenac (VOLTAREN) 75 MG EC tablet Take 1 tablet (75 mg total) by mouth 2 times daily. 20 tablet 0   ? EPINEPHrine (EPI-PEN) 0.3 mg/0.3 mL auto-injector       ? ergocalciferol (VITAMIN D2) 1,250 mcg (50,000 unit) capsule Take 1  capsule (50,000 Units total) by mouth every 7 days. 12 capsule 1   ? fluticasone furoate-vilanteroL (BREO ELLIPTA) 100-25 mcg/dose inhaler Breo Ellipta 100 mcg-25 mcg/dose powder for inhalation   INHALE ONE PUFF BY MOUTH ONE TIME DAILY     ? hydrOXYzine HCL (ATARAX) 10 MG tablet Take 1 tablet (10 mg total) by mouth 3 (three)  times daily as needed for Anxiety. 60 tablet 0   ? ibuprofen (ADVIL,MOTRIN) 800 MG tablet Take 1 tablet (800 mg total) by mouth 2 (two)  times daily as needed for Headaches. 60 tablet 2   ? ipratropium (ATROVENT) 21 mcg (0.03 %) nasal spray Administer 2 sprays into affected nostril every 12 hours.     ? levocetirizine (XYZAL) 5 MG tablet Take 1 tablet (5 mg total) by mouth daily.     ? meclizine (ANTIVERT) 25 mg tablet Take 1 tablet (25 mg  total) by mouth 3 (three)  times daily as needed. 60 tablet 5   ? medroxyPROGESTERone (DEPO-PROVERA) 150 mg/mL injection       ? methylphenidate HCl (METADATE ER) 10 MG ER tablet Take 1 tablet (10 mg total) by mouth every morning. 30 tablet 0   ? montelukast (SINGULAIR) 10 mg tablet       ? nystatin (MYCOSTATIN) 100,000 unit/gram powder Apply to affected areas 4 times a day as needed. 60 g 1   ? ondansetron (ZOFRAN-ODT) 4 MG disintegrating tablet DISSOLVE ONE TABLET ON THE TONGUE EVERY 8 HOURS FOR UP TO 7 DAYS AS NEEDED FOR NAUSEA 20 tablet 1   ? potassium chloride 20 mEq/15 mL solution Take 15 mLs (20 mEq total) by mouth daily. 500 mL 1   ? scopolamine (TRANSDERM-SCOP) 1 mg over 3 days patch Place 1 patch onto the skin every third day. Note: 1.5 mg patch delivers 1 mg over 3 days 10 patch 3   ? tolterodine (DETROL LA) 4 MG 24 hr capsule Take 1 capsule (4 mg total) by mouth daily. 30 capsule 5   ? XHANCE 93 mcg/actuation AerB nasal spray Administer 2 sprays in each nostril daily.       No current facility-administered medications for this visit.     SOC  Social History     Socioeconomic History   ? Marital status: Divorced     Spouse name: Not on file   ? Number of children: Not on file   ? Years of education: Not on file   ? Highest education level: Not on file   Occupational History   ? Not on file   Tobacco Use   ? Smoking status: Former     Packs/day: .5     Types: Cigarettes   ? Smokeless tobacco: Never   ? Tobacco comments:     quti x 1 year   Vaping Use   ? Vaping Use: Former   Substance and Sexual Activity   ? Alcohol use: Yes     Comment: occasional   ? Drug use: No   ? Sexual activity: Yes     Partners: Male   Other Topics Concern   ? Not on file   Social History Narrative   ? Not on file     Social Determinants of Health     Financial Resource Strain: Not on file   Food Insecurity: Not on file   Transportation Needs: Not on file   Physical Activity: Not on file   Stress: Not on file   Social Connections:  Not on file  Housing Stability: Not on file     Patient is currently single.  She lives with her dad and 2 kids who are 55 and 81 years old.  She is working.  She finished school and some college.  No legal issues reported.  No military background.    Psychiatric history and treatment  This is the first time patient is seeing a psychiatrist.    Substance abuse history and treatment  She tells me that she will use alcohol 2 times per week.  No illicit substance use  FAM  Family History   Problem Relation Age of Onset   ? Diverticulitis Mother    ? Hypertension Father    ? Hyperlipidemia Father      There is some substance use issues in family  ROS  Psychiatry-positive for anxiety, depression  Neurological-has issues with focus.    PHYSICAL EXAM  There were no vitals taken for this visit.    MSE    Constitutional:  General Appearance: Casually dressed  General Behavior: Pleasant and cooperative    Musculoskeletal:  Gait and Station: not examined  Strength and Tone: not examined    Psychiatric:  Psychomotor Activity: No motor abnormalities  Speech: Soft  Mood: Anxious  Affect: Full range/appropriate and reactive  Thought Process: Linear, logical, and goal directed  Associations: Intact  Thought Content/Perceptual Disturbances: No evidence of delusions or hallucinations  Cognition/Sensorium: Attention intact  Insight: Fair  Judgment: Fair    LABS  Office Visit on 01/10/2022   Component Date Value   ? Sed Rate 01/10/2022 25    ? Total Vitamin D 25-Hydro* 01/10/2022 16 (L)    Clinical Support on 01/03/2022   Component Date Value   ? Sodium 01/03/2022 139    ? Potassium 01/03/2022 3.4 (L)    ? Chloride 01/03/2022 107    ? CO2 01/03/2022 26    ? BUN 01/03/2022 16    ? Glucose 01/03/2022 91    ? Creatinine 01/03/2022 1.01    ? Calcium 01/03/2022 9.0    ? Total Protein 01/03/2022 7.2    ? Albumin  01/03/2022 4.2    ? Total Bilirubin 01/03/2022 1.0    ? Alkaline Phosphatase 01/03/2022 70    ? AST (SGOT) 01/03/2022 15    ? ALT  (SGPT) 01/03/2022 16    ? Anion Gap 01/03/2022 6    ? Est. GFR 01/03/2022 74    ? Total Cholesterol 01/03/2022 221 (H)    ? Triglycerides 01/03/2022 91    ? HDL Cholesterol 01/03/2022 47    ? LDL Cholesterol Calculat* 40/10/6759 156 (H)    ? Total Chol / HDL Cholest* 01/03/2022 4.7 (H)    ? Non-HDL Cholesterol 01/03/2022 174    ? Coronary Heart Disease R* 01/03/2022     ? WBC 01/03/2022 6.2    ? RBC 01/03/2022 4.18    ? Hemoglobin 01/03/2022 12.3    ? Hematocrit 01/03/2022 36.5    ? MCV 01/03/2022 87.3    ? MCH 01/03/2022 29.5    ? MCHC 01/03/2022 33.8    ? RDW 01/03/2022 12.9    ? Platelets 01/03/2022 238    ? MPV 01/03/2022 8.3    ? Neutrophil % 01/03/2022 52    ? Lymphocyte % 01/03/2022 32    ? Monocyte % 01/03/2022 14    ? Eosinophil % 01/03/2022 2    ? Basophil % 01/03/2022 0    ? Neutrophil Absolute 01/03/2022 3.2    ?  Lymphocyte Absolute 01/03/2022 2.0    ? Monocyte Absolute 01/03/2022 0.9 (H)    ? Eosinophil Absolute 01/03/2022 0.1    ? Basophil Absolute 01/03/2022 0.0    Office Visit on 10/28/2021   Component Date Value   ? Uric Acid 10/28/2021 4.7    ? WBC 10/28/2021 5.9    ? RBC 10/28/2021 4.20    ? Hemoglobin 10/28/2021 12.4    ? Hematocrit 10/28/2021 36.9    ? MCV 10/28/2021 87.8    ? MCH 10/28/2021 29.6    ? MCHC 10/28/2021 33.7    ? RDW 10/28/2021 13.1    ? Platelets 10/28/2021 262    ? MPV 10/28/2021 8.2    ? Neutrophil % 10/28/2021 46    ? Lymphocyte % 10/28/2021 38    ? Monocyte % 10/28/2021 13    ? Eosinophil % 10/28/2021 2    ? Basophil % 10/28/2021 1    ? Neutrophil Absolute 10/28/2021 2.7    ? Lymphocyte Absolute 10/28/2021 2.2    ? Monocyte Absolute 10/28/2021 0.8    ? Eosinophil Absolute 10/28/2021 0.1    ? Basophil Absolute 10/28/2021 0.0      Assessment  Encounter Diagnosis   Name Primary?   ? GAD (generalized anxiety disorder) Yes     Major Depressive Disorder, recurrent episode,  Severe  Generalized Anxiety Disorder     Plan    Patient is not currently on any medicines.  She is doing well  without dose.  Feels like a lot of her anxiety and depression was situational related to her husband.  She does not want to get on any psychotropics.  She will follow-up with her PCP.    1. Risks, benefits and side effects of current psychiatric medications reviewed.  2. Routine labs reviewed.   3. Take medications as prescribed  4.  Keep F/U appointments  5.  Call 911/go to nearest ED if suicidal, homicidal, psychotic, any new emergent symptoms.      Follow Up  Follow up with PCP    This note was prepared using Dragon voice recognition software.                  Electronically signed by: Jeannette How, MD  01/31/22 1432    Electronically signed by Jeannette How, MD at 01/31/2022  2:32 PM EST

## 2022-03-15 DIAGNOSIS — M791 Myalgia, unspecified site: Secondary | ICD-10-CM | POA: Diagnosis not present

## 2022-03-15 DIAGNOSIS — R519 Headache, unspecified: Secondary | ICD-10-CM | POA: Diagnosis not present

## 2022-03-15 DIAGNOSIS — J101 Influenza due to other identified influenza virus with other respiratory manifestations: Secondary | ICD-10-CM

## 2022-03-15 DIAGNOSIS — R000 Tachycardia, unspecified: Secondary | ICD-10-CM | POA: Diagnosis not present

## 2022-03-15 DIAGNOSIS — F419 Anxiety disorder, unspecified: Secondary | ICD-10-CM | POA: Diagnosis not present

## 2022-03-15 DIAGNOSIS — R0981 Nasal congestion: Secondary | ICD-10-CM | POA: Diagnosis not present

## 2022-03-15 NOTE — Discharge Instructions (Addendum)
1. You've been treated in ER with medications and discharged.   2. Follow-up with your test results of your swab on MyChart  3. Treat fever with Tylenol and Motrin  4. Take all medications as prescribed  5. Follow-up with your primary care doctor within 2-3 days for recheck.    6. Drink enough fluids so that you do not become dehydrated.   7. Return to the ER immediately if you feel short of breath or have trouble breathing, have chest pain, severe vomiting, or worsening symptoms  8. Do not return to work/school/other activities outside the house until you have been fever-free for 24 hours without the use of tylenol or motrin

## 2022-03-15 NOTE — ED Triage Notes (Signed)
Ambulatory to triage for generalized body aches and migraine x 1 day. Hx migraines.

## 2022-03-15 NOTE — ED Provider Notes (Signed)
[facility]  Emergency Department Treatment Report  Patient: Vanessa Giles Age: 36 y.o. Sex: female    Date of Birth: 1985/11/24 Admit Date: 03/15/2022 PCP: None, None   MRN: [id number]  CSN: [id number]  Attending: Dr. Sherian Maroon, Albertha Ghee, MD    Room: OTF/OTF Time Dictated: 2:53 AM APP: Blima Rich, MPA, PA-C     (Note to patient:The purpose of this note is to communicate optimally with the other providers involved in your care. It is written using standard medical terminology. The Dragon dictation tool was used in preparation of this note, so please excuse typos/grammatical errors.)    Chief Complaint      Chief Complaint   Patient presents with    Generalized Body Aches    Headache       History of Present Illness   35 y.o. female who presents to the ED with past medical history with reported "shellfish allergy" who states that "I am still can eat shrimp and crab legs" who states she went and ate crab legs on Wednesday and then ate the leftovers on Thursday but states that she was up all night on Friday because she could not sleep and that earlier this morning she woke up with a runny nose, headache, body aches, fever and chills.  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  States she has not been around anyone except for going to get gas and going out to eat and does not believe that this could be the flu or COVID.  Patient states that she took her Atarax, Xyzal, and Antivert and Singulair without improvement of her symptoms.  States her main complaint is that her body aches, headache and that she really wants to go to sleep.    RECORDS REVIEWED: I reviewed the patient's previous records here at [facility] and available outside facilities and note that was seen recently by her family  medicine doctor in the office in October and psychiatry in November.    EXTERNAL RESULTS REVIEWED: Labs, imaging, note    INDEPENDENT HISTORIAN: History and/or plan development assisted by: Family members at bedside    Review of Systems     As per HPI    Past Medical History     Past Medical History:   Diagnosis Date    Abnormal cervical Papanicolaou smear 02/25/2019    Acute urinary tract infection 02/25/2019    Allergic conjunctivitis of both eyes 03/04/2019    Last Assessment & Plan:  Formatting of this note might be different from the original.  See assessment and plan as above for allergic rhinitis.    Allergy with anaphylaxis due to food, subsequent encounter 03/04/2019    Last Assessment & Plan:  Formatting of this note might be different from the original. 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  Patient used to have EpiPen but no longer has one. 2020 bloodwork negative to cinnamon and nutmeg. Interim history - No reactions.   Avoid cinnamon and nutmeg.   For mild symptoms you can take over the counter antihistamines such as Romeo Apple    Attention deficit hyperactivity disorder (ADHD), combined type 06/10/2021    Chronic pain of right knee 10/19/2015    Cigarette  smoker 10/19/2015    Left chronic serous otitis media 05/31/2021    Low grade squamous intraepithelial lesion (LGSIL) on cervicovaginal cytologic smear 01/04/2018    06/16/2019,11/04/2019    Mixed stress and urge urinary incontinence 06/10/2021    Moderate persistent asthma without complication 04/08/2019    Last Assessment & Plan:  Formatting of this note is different from the original. Past history - Patient was diagnosed with asthma over 10 years ago. Triggers are rain, allergies and pet exposure.  The past year she had at least 3 courses of prednisone due to asthma exacerbations. 2020 spirometry was normal with no improvement in FEV1 post bronchodilator treatment. Interim history - only using Breo    Nonintractable migraine  10/19/2015    OSA (obstructive sleep apnea) 08/09/2021    Perennial allergic rhinitis 03/04/2019    Last Assessment & Plan:  Formatting of this note is different from the original. Past history - Perennial rhinoconjunctivitis symptoms for at least 15 years with worsening with weather change.  Tried Xyzal, Singulair, Dymista, Flonase and Nasacort with some benefit.  Skin testing in 2011 was positive to grass, weed, trees, dust mites, cat, dog. 2013 testing was positive to cat, grass, dust mite, t    Snoring 08/09/2021    Vaginal yeast infection 10/19/2015    Vertigo 10/19/2015    Formatting of this note might be different from the original. Probably BPPV Formatting of this note might be different from the original. Probably BPPV       Surgical History   No past surgical history on file.    Family History   No family history on file.  Family History   Problem Relation Age of Onset    Diverticulitis Mother    Hypertension Father    Hyperlipidemia Father   Social History   Aydan Car  reports that she has quit smoking. Her smoking use included cigarettes. She has never used smokeless tobacco. She reports current alcohol use.     Current Medications   Current Outpatient Medications:    albuterol 2.5 mg /3 mL (0.083 %) nebulizer solution, albuterol sulfate 2.5 mg/3 mL (0.083 %) solution for nebulization, Disp: , Rfl:    buPROPion XL (WELLBUTRIN XL) 300 MG 24 hr tablet, Take 1 a day =300 mg., Disp: 30 tablet, Rfl: 3   diclofenac (VOLTAREN) 75 MG EC tablet, Take 1 tablet (75 mg total) by mouth 2 times daily., Disp: 20 tablet, Rfl: 0   EPINEPHrine (EPI-PEN) 0.3 mg/0.3 mL auto-injector, , Disp: , Rfl:    fluticasone furoate-vilanteroL (BREO ELLIPTA) 100-25 mcg/dose inhaler, Breo Ellipta 100 mcg-25 mcg/dose powder for inhalation INHALE ONE PUFF BY MOUTH ONE TIME DAILY, Disp: , Rfl:    hydrOXYzine HCL (ATARAX) 10 MG tablet, Take 1 tablet (10 mg total) by mouth 3 (three) times daily as needed for Anxiety., Disp: 60 tablet, Rfl:  0   ibuprofen (ADVIL,MOTRIN) 800 MG tablet, Take 1 tablet (800 mg total) by mouth 2 (two) times daily as needed for Headaches., Disp: 60 tablet, Rfl: 2   ipratropium (ATROVENT) 21 mcg (0.03 %) nasal spray, Administer 2 sprays into affected nostril every 12 hours., Disp: , Rfl:    levocetirizine (XYZAL) 5 MG tablet, Take 1 tablet (5 mg total) by mouth daily., Disp: , Rfl:    meclizine (ANTIVERT) 25 mg tablet, Take 1 tablet (25 mg total) by mouth 3 (three) times daily as needed., Disp: 60 tablet, Rfl: 5   medroxyPROGESTERone (DEPO-PROVERA) 150 mg/mL injection, , Disp: , Rfl:  methylphenidate HCl (METADATE ER) 10 MG ER tablet, Take 1 tablet (10 mg total) by mouth every morning., Disp: 30 tablet, Rfl: 0   montelukast (SINGULAIR) 10 mg tablet, , Disp: , Rfl:    ondansetron (ZOFRAN-ODT) 4 MG disintegrating tablet, DISSOLVE ONE TABLET ON THE TONGUE EVERY 8 HOURS FOR UP TO 7 DAYS AS NEEDED FOR NAUSEA, Disp: 20 tablet, Rfl: 1   scopolamine (TRANSDERM-SCOP) 1 mg over 3 days patch, Place 1 patch onto the skin every third day. Note: 1.5 mg patch delivers 1 mg over 3 days, Disp: 10 patch, Rfl: 3   tolterodine (DETROL LA) 4 MG 24 hr capsule, Take 1 capsule (4 mg total) by mouth daily., Disp: 30 capsule, Rfl: 5   XHANCE 93 mcg/actuation AerB nasal spray, Administer 2 sprays in each nostril daily., Disp: , Rfl:    nystatin (MYCOSTATIN) 100,000 unit/gram powder, Apply to affected areas 4 times a day as needed., Disp: 60 g, Rfl: 1   potassium chloride 20 mEq/15 mL solution, Take 15 mLs (20 mEq total) by mouth daily., Disp: 500 mL, Rfl: 1    Allergies     Allergies   Allergen Reactions    Doxycycline     Penicillins     Shellfish-Derived Products        Physical Exam     ED TRIAGE VITALS    ED Triage Vitals [03/15/22 2109]   BP Temp Temp src Pulse Respirations SpO2 Height Weight - Scale   125/78 (!) 100.9 F (38.3 C) -- (!) 135 18 100 % 1.626 m (5\' 4" ) 72.6 kg (160 lb)        Physical Exam  Vitals and nursing note reviewed.   HENT:       Head: Normocephalic and atraumatic.      Right Ear: External ear normal.      Left Ear: External ear normal.      Nose: Nose normal.      Mouth/Throat:      Mouth: Mucous membranes are moist.      Pharynx: Oropharynx is clear. Uvula midline. No oropharyngeal exudate, posterior oropharyngeal erythema or uvula swelling.      Tonsils: No tonsillar exudate or tonsillar abscesses. 1+ on the right. 1+ on the left.   Eyes:      Extraocular Movements: Extraocular movements intact.      Conjunctiva/sclera:      Right eye: Right conjunctiva is injected.      Left eye: Left conjunctiva is injected.   Cardiovascular:      Rate and Rhythm: Regular rhythm. Tachycardia present.   Pulmonary:      Effort: Pulmonary effort is normal. No respiratory distress.      Breath sounds: No wheezing.   Abdominal:      General: There is no distension.      Palpations: Abdomen is soft.      Tenderness: There is no abdominal tenderness.   Musculoskeletal:         General: No signs of injury.      Cervical back: Normal range of motion.   Skin:     General: Skin is warm and dry.      Capillary Refill: Capillary refill takes less than 2 seconds.   Neurological:      General: No focal deficit present.      Mental Status: She is alert.      GCS: GCS eye subscore is 4. GCS verbal subscore is 5. GCS motor subscore is 6.  Cranial Nerves: Cranial nerves 2-12 are intact.      Sensory: Sensation is intact.      Motor: Motor function is intact.      Coordination: Coordination is intact.   Psychiatric:         Mood and Affect: Mood is anxious. Affect is tearful.          Impression and Management Plan   36 y.o. female presents with concern for possible allergic reaction, however I feel like this is likely doubtful given how long it has been since she ingested so-called allergen.  She request treatment for allergic reaction.  We will order p.o. Benadryl, Decadron and Pepcid.  I think it is more likely she has a flulike illness.  Differential includes  COVID, flu, RSV, other viral illness.  We will go ahead and treat fever with Tylenol.  Swab her for COVID, flu and RSV.    Further testing and management based on pts response and evolution of sxms.     Diagnostic Studies   Lab:   Recent Results (from the past 24 hour(s))   COVID-19, Flu A/B, and RSV Combo    Collection Time: 03/15/22  9:19 PM    Specimen: Nasopharyngeal Swab   Result Value Ref Range    SARS-CoV-2 NEGATIVE NEGATIVE      Influenza A H1 (Seasonal) PCR NEGATIVE NEGATIVE      Influenza virus B RNA POSITIVE (A) NEGATIVE      RSV A/B PCR NEGATIVE NEGATIVE            ED Course     Patient Vitals for the past 24 hrs:   BP Temp Pulse Resp SpO2 Height Weight   03/15/22 2109 125/78 (!) 100.9 F (38.3 C) (!) 135 18 100 % 1.626 m ( ) 72.6 kg (160 lb)       ED Course as of 03/16/22 0253   Sat Mar 15, 2022   2314 Called pt to inform her of flu + test [SH]      ED Course User Index  [SH] Blima Rich, PA-C       Medications   diphenhydrAMINE (BENADRYL) capsule 50 mg (50 mg Oral Given 03/15/22 2240)   famotidine (PEPCID) tablet 20 mg (20 mg Oral Given 03/15/22 2240)   dexAMETHasone (DECADRON) tablet 12 mg (12 mg Oral Given 03/15/22 2240)   acetaminophen (TYLENOL) tablet 1,000 mg (1,000 mg Oral Given 03/15/22 2240)       Procedures      Severe exacerbation or progression of chronic illness: Migraine    Threat to body function without evaluation and management: HEENT    SOCIAL DETERMINANTS impacting Evaluation and Management:     -  Access to provider given after hours of operation of offices.      -  Patient's care has been impacted by health system's issues including ED overcrowding, boarding, and functioning over capacity.    Comorbidities impacting Evaluation and Management: Allergies, anxiety and depression, sleep apnea    Critical Care Time (if necessary) any critical care time if necessary will be documented by my attending physician    Medical Decision Making   NARRATIVE:  36 y.o. female who presents  to the ED with cc of Zurn for allergic reaction but does not appear to have any evidence for anaphylaxis on physical exam.  She wants to have treatment in the urgency room which is fine.  She will get a dose of Benadryl, Pepcid and steroid.  She looks  more like a viral etiology. I have spoken to Despina HickSharkey, Craig M, MD regarding this patient's care and we discussed patient's history, clinical presentation, physical exam, and my initial diagnosis and plan for care management.  Will give Tylenol for her body aches and fever.  She can be discharged home with outpatient follow-up.    Addendum-patient's viral swab came back positive for flu B.  I called her back and informed of her her result.  I discussed with her importance of hydration, staying away from other people, treating her fever with Tylenol Motrin.    Final Diagnosis     Final diagnoses:   Migraine with typical aura   Body aches   Nasal congestion with rhinorrhea   Fever in adult   Acute viral syndrome   Anxiety state   Influenza B          Disposition   Discharge       Medication List        START taking these medications      acetaminophen 500 MG tablet  Commonly known as: TYLENOL  Take 2 tablets by mouth 4 times daily as needed for Pain     famotidine 20 MG tablet  Commonly known as: Pepcid  Take 1 tablet by mouth 2 times daily               Where to Get Your Medications        Information about where to get these medications is not yet available    Ask your nurse or doctor about these medications  acetaminophen 500 MG tablet  famotidine 20 MG tablet         Type(s) of Personal Protection Equipment (PPE) worn during exam:  gloves, mask    Blima RichSarah C. Jaley Yan, MPA, PA-C  03/15/2022  2:53 AM    The patient was personally evaluated by myself and d/w Dr. Sherian MaroonSharkey, Albertha Gheeraig M, MD who agrees with the above assessment and plan.    My signature above authenticates this document and my orders, the final diagnosis(es), discharge prescription (s), and instructions in the Epic  record. If you have any questions please contact [phone].     Nursing notes have been reviewed by the Physician/Advanced Practice Clinician. Dragon medical dictation software was used for portions of this report. Unintended voice recognition errors may occur.               Blima RichHarter, Elmarie Devlin C, PA-C  03/16/22 [phone]

## 2022-03-15 NOTE — ED Notes (Signed)
EKG and covid swab done      Vanessa Giles  03/15/22 2127

## 2022-03-16 ENCOUNTER — Inpatient Hospital Stay
Admit: 2022-03-16 | Discharge: 2022-03-16 | Disposition: A | Discharge status: Home / Self Care | Payer: BLUE CROSS/BLUE SHIELD | Attending: Emergency Medicine

## 2022-03-16 LAB — COVID-19, FLU A/B, AND RSV COMBO
Influenza A H1 (Seasonal) PCR: NEGATIVE
Influenza virus B RNA: POSITIVE — AB
RSV A/B PCR: NEGATIVE
SARS-CoV-2: NEGATIVE

## 2022-03-16 LAB — EKG 12-LEAD
Atrial Rate: 131 {beats}/min
Calculated P Axis: 64 degrees
Calculated R Axis: 73 degrees
Calculated T Axis: 34 degrees
P-R Interval: 128 ms
Q-T Interval: 376 ms
QRS Duration: 68 ms
QTC Calculation (Bezet): 555 ms
Ventricular Rate: 131 {beats}/min

## 2022-03-16 MED ORDER — FAMOTIDINE 20 MG PO TABS
20 MG | ORAL_TABLET | Freq: Two times a day (BID) | ORAL | 0 refills | Status: AC
Start: 2022-03-16 — End: ?

## 2022-03-16 MED ORDER — ACETAMINOPHEN 500 MG PO TABS
500 MG | ORAL | Status: DC
Start: 2022-03-16 — End: 2022-03-15

## 2022-03-16 MED ORDER — DEXAMETHASONE 4 MG PO TABS
4 MG | ORAL | Status: AC
Start: 2022-03-16 — End: 2022-03-15
  Administered 2022-03-16: 04:00:00 12 mg via ORAL

## 2022-03-16 MED ORDER — IBUPROFEN 600 MG PO TABS
600 MG | ORAL | Status: DC
Start: 2022-03-16 — End: 2022-03-15

## 2022-03-16 MED ORDER — ACETAMINOPHEN 500 MG PO TABS
500 MG | ORAL_TABLET | Freq: Four times a day (QID) | ORAL | 0 refills | Status: AC | PRN
Start: 2022-03-16 — End: ?

## 2022-03-16 MED ORDER — ACETAMINOPHEN 500 MG PO TABS
500 MG | ORAL | Status: AC
Start: 2022-03-16 — End: 2022-03-15
  Administered 2022-03-16: 04:00:00 1000 mg via ORAL

## 2022-03-16 MED ORDER — FAMOTIDINE 10 MG PO TABS
10 MG | ORAL | Status: AC
Start: 2022-03-16 — End: 2022-03-15
  Administered 2022-03-16: 04:00:00 20 mg via ORAL

## 2022-03-16 MED ORDER — DIPHENHYDRAMINE HCL 25 MG PO CAPS
25 MG | ORAL | Status: AC
Start: 2022-03-16 — End: 2022-03-15
  Administered 2022-03-16: 04:00:00 50 mg via ORAL

## 2022-03-16 MED FILL — FAMOTIDINE 10 MG PO TABS: 10 MG | ORAL | Qty: 2

## 2022-03-16 MED FILL — DEXAMETHASONE 4 MG PO TABS: 4 MG | ORAL | Qty: 3

## 2022-03-16 MED FILL — DIPHENHYDRAMINE HCL 25 MG PO CAPS: 25 MG | ORAL | Qty: 2

## 2022-03-16 MED FILL — ACETAMINOPHEN EXTRA STRENGTH 500 MG PO TABS: 500 MG | ORAL | Qty: 2

## 2022-03-18 DIAGNOSIS — J101 Influenza due to other identified influenza virus with other respiratory manifestations: Secondary | ICD-10-CM | POA: Diagnosis not present

## 2022-03-18 DIAGNOSIS — R059 Cough, unspecified: Secondary | ICD-10-CM | POA: Diagnosis not present

## 2022-03-26 NOTE — Progress Notes (Signed)
FOLLOW UP Date of Service/Encounter:  03/28/22   Subjective:  Brittany Watts (DOB: 09-Jul-1985) is a 37 y.o. female who [facility] in re-evaluation of the following:  allergic rhinitis, persistent asthma, stinging insect allergy, penicillin allergy, food allergy  History obtained from: chart review and patient.  For Review, LV was on 08/26/21  with Dr.Krystyn Watts seen for routine follow-up.  Today presents for follow-up. She reports having had the flu over the holiday. Had a really hard time with her breathing. Had a lot of sneezing and drainage, congestion in her ear. This occurred a few days after eating shellfish so she was initially worried about an allergic reaction.  She woke-up the following day with terrible body aches and went to UC and was diagnosed with flu B.  She continued to feel poorly and returned to PCP the day following Christmas. She again tested positive to flu B and a decardon shot.  She has been having to use the nebulizer machine frequently to help keep her chest open. She has felt dizzy because her ears are stopped up and cracking and popping.  She did stop her astelin because it caused a bad taste which is still present. She does find benefit with using saline lavage.   Chart review: she was evaluated in ED on 03/15/22 and diagnosed with viral illness, treated with decadron, benadryl, pepcid per patient request of suspected allergic reaction occurring 48 hours after eating crustaceans which she normally tolerates.  UC visit on 03/18/22 with continued cough, requesting IM prednisone which was given.  Summary of prior history/diagnostics: Moderate persistent asthma without complication Past history - Patient was diagnosed with asthma over 10 years ago. Triggers are rain, allergies and pet exposure.  2022-2023- 3 courses of prednisone due to asthma exacerbations.  2020 -2022 spirometry was normal with no improvement in FEV1 post.  bronchodilator treatment.   Previous treatments include Breo Allergic Rhinitis: SPT in 2011 was positive to grass, weed, trees, dust mites, cat, dog.  SPT 2013 testing was positive to cat, grass, dust mite, trees, ragweed, weed, cockroach, mold, horse, feather. Patient was on allergy injection for a few years but stopped due to localized reactions.  2020 bloodwork only positive to dust mites.  Brittany Watts was attempted but she stopped due to oral pruritus.  She reported a pruritic rash after third dose as well.  Drug challenge on 08/05/2019 was passed and she was restarted on Odactra.  She did not tolerate the oral pruritus and stopped it again.  For medical therapy she has tried Product/process development scientist, Careers adviser, Zyrtec, Claritin, and Xyzal, xhance, Pataday, Lumify.  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  Patient reports strict avoidance of mollusks including scallops, cinnamon, nutmeg.  She eats crustaceans without symptoms. -specific IgE negative to alpha gal, scallop, oyster, lamb, lobster, beef, pork, shrimp, crab, - 2022 cinnamon, nutmeg negative  Penicillin allergy: Broke out in hives at the age of 20 and no penicillin type antibiotics since then.  She has since avoided penicillin derivatives.  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  2020 hymenoptera panel was negative.  She reports no field stings  Allergies as of 03/28/2022       Reactions   Cinnamon Anaphylaxis   Nutmeg Oil (myristica Oil) Anaphylaxis   Shellfish Allergy Itching, Nausea Only   Eyes watering, rhinorrhea. Report from [facility]  Doxycycline Nausea And Vomiting   Penicillins Hives, Itching   Fever, chills   Prednisone Itching        Medication List        Accurate as of March 28, 2022  4:06 PM. If you have any questions, ask your nurse or doctor.          STOP taking these medications    azelastine 0.1  % nasal spray Commonly known as: ASTELIN Stopped by: Brittany Monte, MD   COLLAGEN PO Stopped by: Brittany Monte, MD   fluticasone furoate-vilanterol 100-25 MCG/ACT Aepb Commonly known as: Breo Ellipta Replaced by: fluticasone furoate-vilanterol 200-25 MCG/ACT Aepb Stopped by: Brittany Monte, MD   ipratropium 0.03 % nasal spray Commonly known as: ATROVENT Stopped by: Brittany Monte, MD   Olopatadine HCl 0.2 % Soln Commonly known as: Pataday Stopped by: Brittany Monte, MD   sertraline 50 MG tablet Commonly known as: ZOLOFT Stopped by: Brittany Monte, MD       TAKE these medications    albuterol 108 (90 Base) MCG/ACT inhaler Commonly known as: VENTOLIN HFA Inhale 2 puffs into the lungs every 6 (six) hours as needed. Shortness of breath and wheezing What changed: Another medication with the same name was changed. Make sure you understand how and when to take each. Changed by: Brittany Monte, MD   albuterol (2.5 MG/3ML) 0.083% nebulizer solution Commonly known as: PROVENTIL Use 3 mL vial via nebulizer every 4 to 6 hours as needed for shortness of breath, cough or wheeze. What changed: See the new instructions. Changed by: Brittany Monte, MD   EPINEPHrine 0.3 mg/0.3 mL Soaj injection Commonly known as: Auvi-Q Inject 0.3 mg into the muscle as needed for anaphylaxis.   fluticasone furoate-vilanterol 200-25 MCG/ACT Aepb Commonly known as: Breo Ellipta Inhale 1 puff into the lungs daily. Replaces: fluticasone furoate-vilanterol 100-25 MCG/ACT Aepb Started by: Brittany Monte, MD   levocetirizine 5 MG tablet Commonly known as: XYZAL Take 1 tablet (5 mg total) by mouth daily.   meclizine 25 MG tablet Commonly known as: ANTIVERT Take 25 mg by mouth 3 (three) times daily as needed.   medroxyPROGESTERone 150 MG/ML injection Commonly known as: DEPO-PROVERA Depo-Provera 150 mg/mL intramuscular suspension  Inject 1 mL every 3 months by intramuscular route.   montelukast 10 MG  tablet Commonly known as: Singulair Take 1 tablet (10 mg total) by mouth at bedtime.   MULTIVITAMIN PO Take by mouth.   ondansetron 8 MG disintegrating tablet Commonly known as: ZOFRAN-ODT Take 8 mg by mouth 3 (three) times daily as needed.   scopolamine 1 MG/3DAYS Commonly known as: TRANSDERM-SCOP Transderm-Scop 1.5 mg transdermal patch (1 mg over 3 days)  UNWRAP AND APPLY 1 PATCH TO SKIN EVERY THIRD DAY   tolterodine 4 MG 24 hr capsule Commonly known as: DETROL LA Take 4 mg by mouth daily.   Xhance 93 MCG/ACT Exhu Generic drug: Fluticasone Propionate Place 2 sprays into the nose 2 (two) times daily as needed.       Past Medical History:  Diagnosis Date   Asthma    Seasonal allergies    Past Surgical History:  Procedure Laterality Date   ECTOPIC PREGNANCY SURGERY     Otherwise, there have been no changes to her past medical history, surgical history, family history, or social history.  ROS: All others negative except as noted per HPI.   Objective:  BP 110/70 (BP Location: Right Arm, Patient Position: Sitting, Cuff Size:  Normal)   Pulse 94   Temp 98 F (36.7 C) (Temporal)   SpO2 100%  There is no height or weight on file to calculate BMI. Physical Exam: General Appearance:  Alert, cooperative, no distress, appears stated age  Head:  Normocephalic, without obvious abnormality, atraumatic  Eyes:  Conjunctiva clear, EOM's intact  Nose: Nares normal, normal mucosa  Throat: Lips, tongue normal; teeth and gums normal, normal posterior oropharynx  Neck: Supple, symmetrical  Lungs:   end-expiratory wheezing, Respirations unlabored, intermittent dry coughing  Heart:  regular rate and rhythm and no murmur, Appears well perfused  Extremities: No edema  Skin: Skin color, texture, turgor normal, no rashes or lesions on visualized portions of skin  Neurologic: No gross deficits    Assessment/Plan   Perennial allergic rhinoconjunctivitis with ETD: not controlled May  use saline nasal gel to help with nasal dryness Continue Xhance 2 sprays per nostril twice a day as needed for stuffy nose May use saline nasal rinse or saline nasal spray as needed for nasal symptoms. Use this prior to any medicated nasal sprays Continue Singulair 10mg  daily at night. Continue Xyzal (levocetirizine) 1 tablet once a day.  On days that your allergies are bad you may try taking 1 tablet twice a day.  This may help with current itching Stop azelastine  Please call ENT - referral placed in March 2023 [facility] [address] [place], [place] [address] Ph: [phone] Fax: [phone]   Asthma with exacerbation: not controlled Start Breo 200 mcg 1 puff daily and rinse mouth afterwards. This will replace Breo 100. May use albuterol 2 puffs every 4 hours as needed for cough, wheeze, tightness in chest or shortness of breath. Also, may use albuterol 2 puffs 5-15 minutes prior to exercise.   Asthma control goals:  Full participation in all desired activities (may need albuterol before activity) Albuterol use two times or less a week on average (not counting use with activity) Cough interfering with sleep two times or less a month Oral steroids no more than once a year No hospitalizations IM 40 mg methylprednisolone given in clinic   Allergy with anaphylaxis due to food-stable Avoid mollusks (scallops, oysters, clams, calamari), cinnamon and nut meg. Careful with cross-contamination when eating crustaceans. In case of an allergic reaction, give Benadryl 4 teaspoonfuls every 4 hours, and if life-threatening symptoms occur, inject with EpiPen 0.3 mg.   If interested we can schedule food challenges to cinnamon and nutmeg. Must be done on separate occasions. You must be off antihistamines for 3-5 days before. Plan on being in the office for 2-3 hours and must bring in the food you want to do the oral challenge for. You must call to scheduled an appointment and specify it's for a food  challenge.    Bee sting allergy Avoid bee stings. In case of an allergic reaction, give Benadryl 4 teaspoonfuls every 4 hours, and if life-threatening symptoms occur, inject with EpiPen 0.3 mg.   History of penicillin allergy Continue avoidance and consider penicillin testing in future.   Epistaxis  Pinch both nostrils while leaning forward for at least 5 minutes before checking to see if the bleeding has stopped. If bleeding is not controlled within 5-10 minutes apply a cotton ball soaked with oxymetazoline (Afrin) to the bleeding nostril for a few seconds.  If the problem persists or worsens a referral to ENT for further evaluation may be necessary.  Concern for Sleep Apnea  -Continue follow-up with pulmonary  Follow up  in 6 months, sooner if needed.   It was a pleasure seeing you again today!  Sigurd Sos, MD  [facility] [place]

## 2022-03-28 ENCOUNTER — Other Ambulatory Visit (HOSPITAL_COMMUNITY): Payer: Self-pay

## 2022-03-28 ENCOUNTER — Telehealth: Payer: Self-pay

## 2022-03-28 ENCOUNTER — Ambulatory Visit (INDEPENDENT_AMBULATORY_CARE_PROVIDER_SITE_OTHER): Payer: 59 | Admitting: Internal Medicine

## 2022-03-28 ENCOUNTER — Encounter: Payer: Self-pay | Admitting: Internal Medicine

## 2022-03-28 VITALS — BP 110/70 | HR 94 | Temp 98.0°F

## 2022-03-28 DIAGNOSIS — Z91030 Bee allergy status: Secondary | ICD-10-CM | POA: Diagnosis not present

## 2022-03-28 DIAGNOSIS — T7800XD Anaphylactic reaction due to unspecified food, subsequent encounter: Secondary | ICD-10-CM | POA: Diagnosis not present

## 2022-03-28 DIAGNOSIS — G4733 Obstructive sleep apnea (adult) (pediatric): Secondary | ICD-10-CM

## 2022-03-28 DIAGNOSIS — Z880 Allergy status to penicillin: Secondary | ICD-10-CM

## 2022-03-28 DIAGNOSIS — J3089 Other allergic rhinitis: Secondary | ICD-10-CM

## 2022-03-28 DIAGNOSIS — J4541 Moderate persistent asthma with (acute) exacerbation: Secondary | ICD-10-CM

## 2022-03-28 DIAGNOSIS — Z3042 Encounter for surveillance of injectable contraceptive: Secondary | ICD-10-CM | POA: Diagnosis not present

## 2022-03-28 DIAGNOSIS — J4540 Moderate persistent asthma, uncomplicated: Secondary | ICD-10-CM

## 2022-03-28 DIAGNOSIS — H1013 Acute atopic conjunctivitis, bilateral: Secondary | ICD-10-CM | POA: Diagnosis not present

## 2022-03-28 MED ORDER — EPINEPHRINE 0.3 MG/0.3ML IJ SOAJ: 0.3000 mg | INTRAMUSCULAR | 1 refills | Status: DC | PRN

## 2022-03-28 MED ORDER — METHYLPREDNISOLONE ACETATE 40 MG/ML IJ SUSP
40.0000 mg | Freq: Once | INTRAMUSCULAR | Status: AC
  Administered 2022-03-28: 40 mg via INTRAMUSCULAR

## 2022-03-28 MED ORDER — ALBUTEROL SULFATE HFA 108 (90 BASE) MCG/ACT IN AERS: 2.0000 | INHALATION_SPRAY | Freq: Four times a day (QID) | RESPIRATORY_TRACT | 1 refills | Status: DC | PRN

## 2022-03-28 MED ORDER — LEVOCETIRIZINE DIHYDROCHLORIDE 5 MG PO TABS: 5.0000 mg | ORAL_TABLET | Freq: Every day | ORAL | 5 refills | Status: DC

## 2022-03-28 MED ORDER — FLUTICASONE FUROATE-VILANTEROL 200-25 MCG/ACT IN AEPB: 1.0000 | INHALATION_SPRAY | Freq: Every day | RESPIRATORY_TRACT | 5 refills | Status: DC

## 2022-03-28 MED ORDER — ALBUTEROL SULFATE (2.5 MG/3ML) 0.083% IN NEBU: INHALATION_SOLUTION | RESPIRATORY_TRACT | 3 refills | Status: DC

## 2022-03-28 MED ORDER — MONTELUKAST SODIUM 10 MG PO TABS: 10.0000 mg | ORAL_TABLET | Freq: Every day | ORAL | 5 refills | Status: DC

## 2022-03-28 MED ORDER — XHANCE 93 MCG/ACT NA EXHU: 2.0000 | INHALANT_SUSPENSION | Freq: Two times a day (BID) | NASAL | 5 refills | Status: DC | PRN

## 2022-03-28 NOTE — Patient Instructions (Addendum)
Perennial allergic rhinoconjunctivitis with Eustachian tube dysfunction: May use saline nasal gel to help with nasal dryness Continue Xhance 2 sprays per nostril twice a day as needed for stuffy nose May use saline nasal rinse or saline nasal spray as needed for nasal symptoms. Use this prior to any medicated nasal sprays Continue Singulair 10mg  daily at night. Continue Xyzal (levocetirizine) 1 tablet once a day.  On days that your allergies are bad you may try taking 1 tablet twice a day.  This may help with current itching Stop azelastine  Please call ENT - referral placed in March 2023 [facility] [address] [place], [place] [address] Ph: [phone] Fax: [phone]     Asthma:   Increase Breo 200 mcg 1 puff daily and rinse mouth afterwards.  May use albuterol 2 puffs every 4 hours as needed for cough, wheeze, tightness in chest or shortness of breath. Also, may use albuterol 2 puffs 5-15 minutes prior to exercise.   Asthma control goals:  Full participation in all desired activities (may need albuterol before activity) Albuterol use two times or less a week on average (not counting use with activity) Cough interfering with sleep two times or less a month Oral steroids no more than once a year No hospitalizations   Allergy with anaphylaxis due to food Avoid mollusks (scallops, oysters, clams, calamari), cinnamon and nut meg. Careful with cross-contamination when eating crustaceans. In case of an allergic reaction, give Benadryl 4 teaspoonfuls every 4 hours, and if life-threatening symptoms occur, inject with EpiPen 0.3 mg.   If interested we can schedule food challenges to cinnamon and nutmeg. Must be done on separate occasions. You must be off antihistamines for 3-5 days before. Plan on being in the office for 2-3 hours and must bring in the food you want to do the oral challenge for. You must call to scheduled an appointment and specify it's for a food challenge.    Bee sting  allergy Avoid bee stings. In case of an allergic reaction, give Benadryl 4 teaspoonfuls every 4 hours, and if life-threatening symptoms occur, inject with EpiPen 0.3 mg.   History of penicillin allergy Continue avoidance and consider penicillin testing in future.   Epistaxis  Pinch both nostrils while leaning forward for at least 5 minutes before checking to see if the bleeding has stopped. If bleeding is not controlled within 5-10 minutes apply a cotton ball soaked with oxymetazoline (Afrin) to the bleeding nostril for a few seconds.  If the problem persists or worsens a referral to ENT for further evaluation may be necessary.  Concern for Sleep Apnea  -Continue follow-up with pulmonary  Follow up in 6 months, sooner if needed.   It was a pleasure seeing you again today!

## 2022-03-28 NOTE — Telephone Encounter (Signed)
Patient Advocate Encounter   Received notification from [employer] that prior authorization is required for [facility] 93MCG/ACT exhaler suspension   Submitted: 03-28-2022 [place] [id number]   Status is pending

## 2022-03-31 ENCOUNTER — Other Ambulatory Visit (HOSPITAL_COMMUNITY): Payer: Self-pay

## 2022-03-31 NOTE — Telephone Encounter (Signed)
Patient Advocate Encounter  Received a fax from [employer] regarding Prior Authorization for [facility] 93MCG/ACT exhaler suspension .   Authorization has been DENIED due to Coverage for this medication is denied for the following reason(s). We reviewed the information we received about your condition and circumstances. We used the plan approved policy when making this decision. The policy states that this medication may be approved when:  -The member has a clinical condition or needs a specific dosage form for which there is no alternative on the formulary OR -The listed formulary alternatives are not recommended based on published guidelines or clinical literature OR -The formulary alternatives will likely be ineffective or less effective for the member OR -The formulary alternatives will likely cause an adverse effect OR - The member is unable to take the required number of formulary alternatives for the given diagnosis due to a trial and inadequate treatment response or contraindication OR -The member has tried and failed the required number of formulary alternatives. Based on the policy and the information we have, your request is denied. We did not receive any documentation that you meet any of the criteria outlined above. Formulary alternative(s) are mometasone nasal spray. Requirement: 3 in a class with 3 or more alternatives, 2 in a class with 2 alternatives, or 1 in a class with only 1 alternative. Please refer to your plan documents for a complete list of alternatives. Note: Formulary alternatives may require a prior authorization.   Test claim for mometasone nasal spray shows a co-pay of $15.00   Determination letter attached to patient chart

## 2022-03-31 NOTE — Telephone Encounter (Signed)
Please send mometasone.

## 2022-03-31 NOTE — Telephone Encounter (Signed)
Xhance denied mometasone nasal spray  is preferred

## 2022-04-01 MED ORDER — MOMETASONE FUROATE 50 MCG/ACT NA SUSP: 2.0000 | Freq: Every day | NASAL | 5 refills | Status: DC

## 2022-04-01 NOTE — Telephone Encounter (Signed)
Sent in mometasone in place of the ryaltris

## 2022-06-24 ENCOUNTER — Other Ambulatory Visit: Payer: Self-pay

## 2022-06-24 ENCOUNTER — Other Ambulatory Visit: Payer: Self-pay | Admitting: Internal Medicine

## 2022-06-24 ENCOUNTER — Encounter: Payer: Self-pay | Admitting: Family Medicine

## 2022-06-24 ENCOUNTER — Ambulatory Visit (INDEPENDENT_AMBULATORY_CARE_PROVIDER_SITE_OTHER): Payer: 59 | Admitting: Family Medicine

## 2022-06-24 VITALS — BP 100/60 | HR 81 | Temp 98.7°F | Resp 20 | Wt 159.9 lb

## 2022-06-24 DIAGNOSIS — J3089 Other allergic rhinitis: Secondary | ICD-10-CM | POA: Diagnosis not present

## 2022-06-24 DIAGNOSIS — J4540 Moderate persistent asthma, uncomplicated: Secondary | ICD-10-CM

## 2022-06-24 DIAGNOSIS — Z880 Allergy status to penicillin: Secondary | ICD-10-CM

## 2022-06-24 DIAGNOSIS — H1013 Acute atopic conjunctivitis, bilateral: Secondary | ICD-10-CM | POA: Diagnosis not present

## 2022-06-24 DIAGNOSIS — G4733 Obstructive sleep apnea (adult) (pediatric): Secondary | ICD-10-CM | POA: Diagnosis not present

## 2022-06-24 DIAGNOSIS — Z91030 Bee allergy status: Secondary | ICD-10-CM | POA: Diagnosis not present

## 2022-06-24 DIAGNOSIS — T7800XD Anaphylactic reaction due to unspecified food, subsequent encounter: Secondary | ICD-10-CM

## 2022-06-24 DIAGNOSIS — H6993 Unspecified Eustachian tube disorder, bilateral: Secondary | ICD-10-CM | POA: Diagnosis not present

## 2022-06-24 MED ORDER — FLUTICASONE FUROATE-VILANTEROL 200-25 MCG/ACT IN AEPB: 1.0000 | INHALATION_SPRAY | Freq: Every day | RESPIRATORY_TRACT | 5 refills | Status: DC

## 2022-06-24 MED ORDER — CARBINOXAMINE MALEATE 4 MG PO TABS: 4.0000 mg | ORAL_TABLET | Freq: Four times a day (QID) | ORAL | 2 refills | Status: DC | PRN

## 2022-06-24 NOTE — Progress Notes (Signed)
[place] [address] Dept: [phone]  FOLLOW UP NOTE  Patient ID: Brittany Watts, female    DOB: [birth year]  Age: 37 y.o. MRN: [id number] Date of Office Visit: 06/24/2022  Assessment  Chief Complaint: Cough, Establish Care, and Follow-up (Going to [place] sinus issues)  HPI Brittany Watts is a 37 year old female who presents to the clinic for follow-up visit.  She was last seen in this clinic on 03/28/2022 by Dr. Simona Huh for evaluation of asthma, allergic rhinitis, stinging insect allergy, penicillin allergy, food allergy to cinnamon, nutmeg, and mollusks, epistaxis with an ENT referral as well as obstructive sleep apnea for which she follows pulmonary specialist.    At today's visits, she reports her asthma has been well-controlled with no shortness of breath, cough, or wheeze with activity or rest.  She continues Breo 200-1 puff once a day and montelukast 10 mg once a day.  She reports that she uses albuterol via nebulizer about once a month with relief of symptoms.    Allergic rhinitis is reported as poorly controlled with symptoms that began about 2 weeks ago including clear rhinorrhea, nasal congestion, sneezing, postnasal drainage, and itch in her throat.  She reports these symptoms are aggravated by tree pollen and rain.  She continues levocetirizine 5 mg once a day, montelukast once a day, and Xhance daily.  She does report that she has tried azelastine, however, was unable to tolerate the taste of this medication.  She had environmental allergy skin testing in 2013 that was positive to cat, grass pollen, dust mite, tree pollen, ragweed pollen, weed pollen, cockroach, mold, horse, and feather.  She was on allergen immunotherapy for a few years, however, stopped this therapy due to large localized reactions.  She had allergy testing via lab work in 2020 that was positive to dust mite only.  She reports bilateral ear fullness and hearing popping and clicking sounds.   She denies ear pain, discharge from her ear, or change in hearing.  She continues to avoid cinnamon, nutmeg, and mollusks with no accidental ingestion or EpiPen use since her last visit to this clinic. She continues to avoid stinging insects with no incidences or EpiPen use since her last visit to this clinic.  She continues to avoid compounds containing penicillin with no accidental ingestions since her last visit to this clinic.  She does report an episode of epistaxis from the left nostril for which she has previously seen an ENT specialist.  She reports that nosebleed stopped in under 5 minutes.  She continues to follow up with pulmonology regarding sleep apnea and CPAP use.  Her current medications are listed in the chart.   Drug Allergies:  Allergies  Allergen Reactions   Cinnamon Anaphylaxis   Nutmeg Oil (Myristica Oil) Anaphylaxis   Shellfish Allergy Itching and Nausea Only    Eyes watering, rhinorrhea. Report from [facility]   Doxycycline Nausea And Vomiting   Penicillins Hives and Itching    Fever, chills   Prednisone Itching    Physical Exam: BP 100/60 (BP Location: Left Arm, Patient Position: Sitting, Cuff Size: Normal)   Pulse 81   Temp 98.7 F (37.1 C) (Temporal)   Resp 20   Wt 159 lb 14.4 oz (72.5 kg)   SpO2 100%   BMI 27.23 kg/m    Physical Exam Vitals reviewed.  Constitutional:      Appearance: Normal appearance.  HENT:     Head: Normocephalic and atraumatic.  Right Ear: Tympanic membrane normal.     Left Ear: Tympanic membrane normal.     Nose:     Comments: Bilateral nares slightly erythematous with clear nasal drainage noted.  Pharynx normal.  Ears normal.  Eyes normal.    Mouth/Throat:     Pharynx: Oropharynx is clear.  Eyes:     Conjunctiva/sclera: Conjunctivae normal.  Cardiovascular:     Rate and Rhythm: Normal rate and regular rhythm.     Heart sounds: Normal heart sounds. No murmur heard. Pulmonary:     Effort:  Pulmonary effort is normal.     Breath sounds: Normal breath sounds.     Comments: Lungs clear to auscultation Musculoskeletal:        General: Normal range of motion.     Cervical back: Normal range of motion and neck supple.  Skin:    General: Skin is warm and dry.  Neurological:     Mental Status: She is alert and oriented to person, place, and time.  Psychiatric:        Mood and Affect: Mood normal.        Behavior: Behavior normal.        Thought Content: Thought content normal.        Judgment: Judgment normal.     Diagnostics: FVC 2.67, FEV1 2.12.  Predicted FVC 3.18, predicted FEV1 2.64.  Spirometry indicates normal ventilatory function.  Assessment and Plan: 1. Moderate persistent asthma without complication   2. Bee sting allergy   3. Allergy with anaphylaxis due to food, subsequent encounter   4. Allergic conjunctivitis of both eyes   5. Perennial allergic rhinitis   6. Obstructive sleep apnea syndrome   7. History of penicillin allergy   8. Dysfunction of both eustachian tubes     Meds ordered this encounter  Medications   fluticasone furoate-vilanterol (BREO ELLIPTA) 200-25 MCG/ACT AEPB    Sig: Inhale 1 puff into the lungs daily.    Dispense:  1 each    Refill:  5   Carbinoxamine Maleate 4 MG TABS    Sig: Take 1 tablet (4 mg total) by mouth 4 (four) times daily as needed.    Dispense:  120 tablet    Refill:  2    Patient Instructions  Asthma Continue Breo 200-1 puff once a day to prevent cough or wheeze Continue montelukast 10 mg once a day to prevent cough or wheeze Continue albuterol 2 puffs every 4 hours as needed for cough or wheeze OR Instead use albuterol 0.083% solution via nebulizer one unit vial every 4 hours as needed for cough or wheeze You may use albuterol 5 to 15 minutes before activity to decrease cough or wheeze  Allergic rhinitis Begin carbinoxamine 4 mg tablets. Take 2 tablets in the morning and 2 tablets in the evening for nasal  symptoms Continue montelukast once a day as listed above Continue Xhance 2 sprays in each nostril twice a day for a stuffy nose or drainage Begin ipratropium 2 sprays in each nostril up to twice a day as needed for a runny nose Continue nasal saline gel as needed for dry nostrils Consider saline nasal rinses as needed for nasal symptoms. 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  In case of an allergic reaction, take Benadryl 50 mg every 4 hours, and if life-threatening symptoms occur, inject with AuviQ 0.3 mg. Consider skin testing to the hymenoptera panel  Penicillin  allergy Consider penicillin skin testing followed by oral challenge.  Epistaxis Pinch both nostrils while leaning forward for at least 5 minutes before checking to see if the bleeding has stopped. If bleeding is not controlled within 5-10 minutes apply a cotton ball soaked with oxymetazoline (Afrin) to the bleeding nostril for a few seconds.  If the problem persists or worsens a referral to ENT for further evaluation may be necessary.  Obstructive sleep apnea Continue to follow-up with your pulmonary specialist team  Eustachian tube dysfunction Continue nasal saline rinses and Xhance.  We have ordered Xhance from specialty pharmacy which should provide a more uniform distribution of this drug.  Call the clinic if this treatment plan is not working well for you.  Follow up in 2 months or sooner if needed.   Return in about 2 months (around 08/24/2022), or if symptoms worsen or fail to improve.    Thank you for the opportunity to care for this patient.  Please do not hesitate to contact me with questions.  Gareth Morgan, FNP [facility] [place]

## 2022-06-24 NOTE — Patient Instructions (Addendum)
Asthma Continue Breo 200-1 puff once a day to prevent cough or wheeze Continue montelukast 10 mg once a day to prevent cough or wheeze Continue albuterol 2 puffs every 4 hours as needed for cough or wheeze OR Instead use albuterol 0.083% solution via nebulizer one unit vial every 4 hours as needed for cough or wheeze You may use albuterol 5 to 15 minutes before activity to decrease cough or wheeze  Allergic rhinitis Begin carbinoxamine 4 mg tablets. Take 2 tablets in the morning and 2 tablets in the evening for nasal symptoms Continue montelukast once a day as listed above Continue Xhance 2 sprays in each nostril twice a day for a stuffy nose or drainage Begin ipratropium 2 sprays in each nostril up to twice a day as needed for a runny nose Continue nasal saline gel as needed for dry nostrils Consider saline nasal rinses as needed for nasal symptoms. 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  In case of an allergic reaction, take Benadryl 50 mg every 4 hours, and if life-threatening symptoms occur, inject with AuviQ 0.3 mg. Consider skin testing to the hymenoptera panel  Penicillin allergy Consider penicillin skin testing followed by oral challenge.  Epistaxis Pinch both nostrils while leaning forward for at least 5 minutes before checking to see if the bleeding has stopped. If bleeding is not controlled within 5-10 minutes apply a cotton ball soaked with oxymetazoline (Afrin) to the bleeding nostril for a few seconds.  If the problem persists or worsens a referral to ENT for further evaluation may be necessary.  Obstructive sleep apnea Continue to follow-up with your pulmonary specialist team  Eustachian tube dysfunction Continue nasal saline rinses and Xhance.  We have ordered Xhance from specialty pharmacy which should provide a more uniform distribution of this drug.  Call the clinic if this treatment  plan is not working well for you.  Follow up in 2 months or sooner if needed.   Control of Dust Mite Allergen Dust mites play a major role in allergic asthma and rhinitis. They occur in environments with high humidity wherever human skin is found. Dust mites absorb humidity from the atmosphere (ie, they do not drink) and feed on organic matter (including shed human and animal skin). Dust mites are a microscopic type of insect that you cannot see with the naked eye. High levels of dust mites have been detected from mattresses, pillows, carpets, upholstered furniture, bed covers, clothes, soft toys and any woven material. The principal allergen of the dust mite is found in its feces. A gram of dust may contain 1,000 mites and 250,000 fecal particles. Mite antigen is easily measured in the air during house cleaning activities. Dust mites do not bite and do not cause harm to humans, other than by triggering allergies/asthma.  Ways to decrease your exposure to dust mites in your home:  1. Encase mattresses, box springs and pillows with a mite-impermeable barrier or cover  2. Wash sheets, blankets and drapes weekly in hot water (130 F) with detergent and dry them in a dryer on the hot setting.  3. Have the room cleaned frequently with a vacuum cleaner and a damp dust-mop. For carpeting or rugs, vacuuming with a vacuum cleaner equipped with a high-efficiency particulate air (HEPA) filter. The dust mite allergic individual should not be in a room which is being cleaned and should wait 1 hour after cleaning before going into the room.  4. Do not sleep on upholstered furniture (eg, couches).  5. If possible removing carpeting, upholstered furniture and drapery from the home is ideal. Horizontal blinds should be eliminated in the rooms where the person spends the most time (bedroom, study, television room). Washable vinyl, roller-type shades are optimal.  6. Remove all non-washable stuffed toys from the  bedroom. Wash stuffed toys weekly like sheets and blankets above.  7. Reduce indoor humidity to less than 50%. Inexpensive humidity monitors can be purchased at most hardware stores. Do not use a humidifier as can make the problem worse and are not recommended.

## 2022-07-02 NOTE — Progress Notes (Signed)
FOLLOW UP Date of Service/Encounter:  07/04/22   Subjective:  Brittany Watts (DOB: 10/03/1985) is a 37 y.o. 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  History obtained from: chart review and patient.  For Review, LV was on 06/24/22  with Thermon LeylandAnne Ambs, FNP seen for routine follow-up. See below for summary of history and diagnostics.  Therapeutic plans/changes recommended: Doing well with asthma, allergic rhinitis not controlled. Otherwise well. FEV1 2.12L. Started on carbinoxamine and ipatropium nasal spray. Xhance ordered as well.   Summary of prior history/diagnostics: Moderate persistent asthma without complication Past history - Patient was diagnosed with asthma over 10 years ago. Triggers are rain, allergies and pet exposure.  2022-2023- 3 courses of prednisone due to asthma exacerbations.  2020 -2022 spirometry was normal with no improvement in FEV1 post. bronchodilator treatment.   Previous treatments include Breo Allergic Rhinitis: SPT in 2011 was positive to grass, weed, trees, dust mites, cat, dog.  SPT 2013 testing was positive to cat, grass, dust mite, trees, ragweed, weed, cockroach, mold, horse, feather. Patient was on allergy injection for a few years but stopped due to localized reactions.  2020 bloodwork only positive to dust mites.  Isaiah SergeOdactra was attempted but she stopped due to oral pruritus.  She reported a pruritic rash after third dose as well.  Drug challenge on 08/05/2019 was passed and she was restarted on Odactra.  She did not tolerate the oral pruritus and stopped it again.  For medical therapy she has tried Product/process development scientistAlavert, Careers adviserAllegra, Zyrtec, Claritin, and Xyzal, xhance, Pataday, Lumify.  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  Patient reports strict avoidance of mollusks including scallops, cinnamon, nutmeg.  She eats crustaceans without symptoms. 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  She has since avoided penicillin derivatives.  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  2020 hymenoptera panel was negative.  She reports no field stings  Today presents for follow-up. [place] [place]   Her nose has been completely stopped up.  She is using carbinoxamine which is helping some. However, she is waking up at night and having runny nose and what feels like a scab in her nostril that will come out when she blows.  She has tried a plethora of allergy medications which do not seem to be helping her symptoms. She is out of SahuaritaXhance and this has not been delivered to her home.  She is worried about being this congested for her upcoming trip. Her last steroid injection was in January for asthma exacerbation. Her asthma is controlled, not using rescue inhaler since January.  She tends to do well if remains away from the rain.  Her ears continue to feel stopped up.    Allergies as of 07/04/2022       Reactions   Cinnamon Anaphylaxis   Nutmeg Oil (myristica Oil) Anaphylaxis   Shellfish Allergy Itching, Nausea Only   Eyes watering, rhinorrhea. [facility]   Doxycycline Nausea And Vomiting   Penicillins Hives, Itching   Fever, chills   Prednisone Itching        Medication List        Accurate as of July 04, 2022 12:24 PM. If you have any questions, ask your nurse or doctor.          albuterol 108 (90 Base) MCG/ACT  inhaler Commonly known as: VENTOLIN HFA Inhale 2 puffs into the lungs every 6 (six) hours as needed. Shortness of breath and wheezing   albuterol (2.5 MG/3ML) 0.083% nebulizer solution Commonly known as: PROVENTIL Use 3 mL vial via nebulizer every 4 to 6 hours as needed for shortness of breath, cough or wheeze.   Carbinoxamine Maleate 4 MG Tabs Take 1 tablet (4 mg total) by mouth 4 (four) times daily as needed.   EPINEPHrine 0.3 mg/0.3 mL Soaj injection Commonly known as: Auvi-Q Inject 0.3 mg into the muscle as needed for anaphylaxis.   fluticasone furoate-vilanterol 200-25 MCG/ACT Aepb Commonly known as: Breo Ellipta Inhale 1 puff into the lungs daily.   ipratropium 0.03 % nasal spray Commonly known as: ATROVENT PLACE 2 SPRAYS INTO BOTH NOSTRILS EVERY 12 HOURS   levocetirizine 5 MG tablet Commonly known as: XYZAL Take 1 tablet (5 mg total) by mouth daily.   meclizine 25 MG tablet Commonly known as: ANTIVERT Take 25 mg by mouth 3 (three) times daily as needed.   medroxyPROGESTERone 150 MG/ML injection Commonly known as: DEPO-PROVERA Depo-Provera 150 mg/mL intramuscular suspension  Inject 1 mL every 3 months by intramuscular route.   mometasone 50 MCG/ACT nasal spray Commonly known as: NASONEX Place 2 sprays into the nose daily.   montelukast 10 MG tablet Commonly known as: Singulair Take 1 tablet (10 mg total) by mouth at bedtime.   MULTIVITAMIN PO Take by mouth.   ondansetron 8 MG disintegrating tablet Commonly known as: ZOFRAN-ODT Take 8 mg by mouth 3 (three) times daily as needed.   scopolamine 1 MG/3DAYS Commonly known as: TRANSDERM-SCOP Transderm-Scop 1.5 mg transdermal patch (1 mg over 3 days)  UNWRAP AND APPLY 1 PATCH TO SKIN EVERY THIRD DAY   tolterodine 4 MG 24 hr capsule Commonly known as: DETROL LA Take 4 mg by mouth daily.   Xhance 93 MCG/ACT Exhu Generic drug: Fluticasone Propionate Place 2 sprays into the nose 2 (two) times daily as  needed.       Past Medical History:  Diagnosis Date   Asthma    Seasonal allergies    Past Surgical History:  Procedure Laterality Date   ECTOPIC PREGNANCY SURGERY     Otherwise, there have been no changes to her past medical history, surgical history, family history, or social history.  ROS: All others negative except as noted per HPI.   Objective:  BP 98/68   Pulse 98   Temp 98 F (36.7 C) (Temporal)   Resp 17   Wt 160 lb 12.8 oz (72.9 kg)   SpO2 99%   BMI 27.39 kg/m  Body mass index is 27.39 kg/m. Physical Exam: General Appearance:  Alert, cooperative, no distress, appears stated age  Head:  Normocephalic, without obvious abnormality, atraumatic  Eyes:  Conjunctiva clear, EOM's intact  Nose: Nares normal, hypertrophic turbinates, normal mucosa, and no visible anterior polyps  Throat: Lips, tongue normal; teeth and gums normal, normal posterior oropharynx  Neck: Supple, symmetrical  Lungs:   clear to auscultation bilaterally, Respirations unlabored, no coughing  Heart:  regular rate and rhythm  and no murmur, Appears well perfused  Extremities: No edema  Skin: Skin color, texture, turgor normal and no rashes or lesions on visualized portions of skin  Neurologic: No gross deficits   Assessment/Plan   Asthma-controlled Continue Breo 200-1 puff once a day to prevent cough or wheeze Continue montelukast 10 mg once a day to prevent cough or wheeze Continue albuterol 2 puffs every 4 hours as needed for cough or wheeze OR Instead use albuterol 0.083% solution via nebulizer one unit vial every 4 hours as needed for cough or wheeze You may use albuterol 5 to 15 minutes before activity to decrease cough or wheeze  Allergic rhinitis with flare-not controlled Continue carbinoxamine 4 mg tablets. Take 2 tablets in the morning and 2 tablets in the evening for nasal symptoms Continue montelukast once a day as listed above Continue Xhance 2 sprays in each nostril twice a day  for a stuffy nose or drainage Continue nasal saline gel as needed for dry nostrils Consider saline nasal rinses as needed for nasal symptoms. Use this before any medicated nasal sprays for best result - IM 40 mg depomedrol given today in clinic  Stinging insect allergy-stable Continue to avoid stinging insects.  In case of an allergic reaction, take Benadryl 50 mg every 4 hours, and if life-threatening symptoms occur, inject with AuviQ 0.3 mg. Consider skin testing to the hymenoptera panel  Penicillin allergy-stable Consider penicillin skin testing followed by oral challenge.  Epistaxis-controlled Pinch both nostrils while leaning forward for at least 5 minutes before checking to see if the bleeding has stopped. If bleeding is not controlled within 5-10 minutes apply a cotton ball soaked with oxymetazoline (Afrin) to the bleeding nostril for a few seconds.  If the problem persists or worsens a referral to ENT for further evaluation may be necessary.  Obstructive sleep apnea Continue to follow-up with your pulmonary specialist team  Eustachian tube dysfunction-not controlled Continue nasal saline rinses and Xhance.  We have ordered Xhance from specialty pharmacy which should provide a more uniform distribution of this drug. [facility].  Call the clinic if this treatment plan is not working well for you.  Follow up in 6 months or sooner if needed.  Tonny Bollman, MD  [facility]

## 2022-07-04 ENCOUNTER — Ambulatory Visit (INDEPENDENT_AMBULATORY_CARE_PROVIDER_SITE_OTHER): Payer: 59 | Admitting: Internal Medicine

## 2022-07-04 ENCOUNTER — Encounter: Payer: Self-pay | Admitting: Internal Medicine

## 2022-07-04 VITALS — BP 98/68 | HR 98 | Temp 98.0°F | Resp 17 | Wt 160.8 lb

## 2022-07-04 DIAGNOSIS — Z880 Allergy status to penicillin: Secondary | ICD-10-CM

## 2022-07-04 DIAGNOSIS — Z91030 Bee allergy status: Secondary | ICD-10-CM | POA: Diagnosis not present

## 2022-07-04 DIAGNOSIS — G4733 Obstructive sleep apnea (adult) (pediatric): Secondary | ICD-10-CM

## 2022-07-04 DIAGNOSIS — J3089 Other allergic rhinitis: Secondary | ICD-10-CM

## 2022-07-04 DIAGNOSIS — J4540 Moderate persistent asthma, uncomplicated: Secondary | ICD-10-CM

## 2022-07-04 DIAGNOSIS — H6993 Unspecified Eustachian tube disorder, bilateral: Secondary | ICD-10-CM

## 2022-07-04 MED ORDER — XHANCE 93 MCG/ACT NA EXHU: 2.0000 | INHALANT_SUSPENSION | Freq: Two times a day (BID) | NASAL | 5 refills | Status: DC | PRN

## 2022-07-04 MED ORDER — METHYLPREDNISOLONE ACETATE 40 MG/ML IJ SUSP
40.0000 mg | Freq: Once | INTRAMUSCULAR | Status: AC
  Administered 2022-07-04: 40 mg via INTRAMUSCULAR

## 2022-07-04 NOTE — Patient Instructions (Addendum)
Asthma Continue Breo 200-1 puff once a day to prevent cough or wheeze Continue montelukast 10 mg once a day to prevent cough or wheeze Continue albuterol 2 puffs every 4 hours as needed for cough or wheeze OR Instead use albuterol 0.083% solution via nebulizer one unit vial every 4 hours as needed for cough or wheeze You may use albuterol 5 to 15 minutes before activity to decrease cough or wheeze  Allergic rhinitis with flare Continue carbinoxamine 4 mg tablets. Take 2 tablets in the morning and 2 tablets in the evening for nasal symptoms Continue montelukast once a day as listed above Continue Xhance 2 sprays in each nostril twice a day for a stuffy nose or drainage Continue nasal saline gel as needed for dry nostrils Consider saline nasal rinses as needed for nasal symptoms. 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  In case of an allergic reaction, take Benadryl 50 mg every 4 hours, and if life-threatening symptoms occur, inject with AuviQ 0.3 mg. Consider skin testing to the hymenoptera panel  Penicillin allergy Consider penicillin skin testing followed by oral challenge.  Epistaxis Pinch both nostrils while leaning forward for at least 5 minutes before checking to see if the bleeding has stopped. If bleeding is not controlled within 5-10 minutes apply a cotton ball soaked with oxymetazoline (Afrin) to the bleeding nostril for a few seconds.  If the problem persists or worsens a referral to ENT for further evaluation may be necessary.  Obstructive sleep apnea Continue to follow-up with your pulmonary specialist team  Eustachian tube dysfunction Continue nasal saline rinses and Xhance.  We have ordered Xhance from specialty pharmacy which should provide a more uniform distribution of this drug. [facility].  Call the clinic if this treatment plan is not  working well for you.  Follow up in 6 months or sooner if needed.   Control of Dust Mite Allergen Dust mites play a major role in allergic asthma and rhinitis. They occur in environments with high humidity wherever human skin is found. Dust mites absorb humidity from the atmosphere (ie, they do not drink) and feed on organic matter (including shed human and animal skin). Dust mites are a microscopic type of insect that you cannot see with the naked eye. High levels of dust mites have been detected from mattresses, pillows, carpets, upholstered furniture, bed covers, clothes, soft toys and any woven material. The principal allergen of the dust mite is found in its feces. A gram of dust may contain 1,000 mites and 250,000 fecal particles. Mite antigen is easily measured in the air during house cleaning activities. Dust mites do not bite and do not cause harm to humans, other than by triggering allergies/asthma.  Ways to decrease your exposure to dust mites in your home:  1. Encase mattresses, box springs and pillows with a mite-impermeable barrier or cover  2. Wash sheets, blankets and drapes weekly in hot water (130 F) with detergent and dry them in a dryer on the hot setting.  3. Have the room cleaned frequently with a vacuum cleaner and a damp dust-mop. For carpeting or rugs, vacuuming with a vacuum cleaner equipped with a high-efficiency particulate air (HEPA) filter. The dust mite allergic individual should not be in a room which is being cleaned and should wait 1 hour after cleaning before going into the room.  4. Do not sleep  on upholstered furniture (eg, couches).  5. If possible removing carpeting, upholstered furniture and drapery from the home is ideal. Horizontal blinds should be eliminated in the rooms where the person spends the most time (bedroom, study, television room). Washable vinyl, roller-type shades are optimal.  6. Remove all non-washable stuffed toys from the bedroom. Wash  stuffed toys weekly like sheets and blankets above.  7. Reduce indoor humidity to less than 50%. Inexpensive humidity monitors can be purchased at most hardware stores. Do not use a humidifier as can make the problem worse and are not recommended.

## 2022-08-05 ENCOUNTER — Telehealth: Payer: Self-pay | Admitting: Internal Medicine

## 2022-08-05 NOTE — Telephone Encounter (Signed)
Pharmacy called needing a PA for Fluticasone Propionate (XHANCE) 93 MCG/ACT EXHU [[id number]] please advise

## 2022-08-06 NOTE — Telephone Encounter (Signed)
Previously denied, see previous notes.

## 2022-10-03 ENCOUNTER — Ambulatory Visit: Payer: 59 | Admitting: Internal Medicine

## 2022-10-10 ENCOUNTER — Ambulatory Visit (INDEPENDENT_AMBULATORY_CARE_PROVIDER_SITE_OTHER): Payer: Medicaid Other | Admitting: Internal Medicine

## 2022-10-10 ENCOUNTER — Encounter: Payer: Self-pay | Admitting: Internal Medicine

## 2022-10-10 VITALS — BP 88/54 | HR 88 | Temp 98.4°F | Resp 16

## 2022-10-10 DIAGNOSIS — J3089 Other allergic rhinitis: Secondary | ICD-10-CM | POA: Diagnosis not present

## 2022-10-10 DIAGNOSIS — J4540 Moderate persistent asthma, uncomplicated: Secondary | ICD-10-CM | POA: Diagnosis not present

## 2022-10-10 DIAGNOSIS — H6993 Unspecified Eustachian tube disorder, bilateral: Secondary | ICD-10-CM | POA: Diagnosis not present

## 2022-10-10 DIAGNOSIS — Z91030 Bee allergy status: Secondary | ICD-10-CM

## 2022-10-10 DIAGNOSIS — G4733 Obstructive sleep apnea (adult) (pediatric): Secondary | ICD-10-CM

## 2022-10-10 DIAGNOSIS — Z880 Allergy status to penicillin: Secondary | ICD-10-CM

## 2022-10-10 MED ORDER — MONTELUKAST SODIUM 10 MG PO TABS: 10.0000 mg | ORAL_TABLET | Freq: Every day | ORAL | 1 refills | Status: DC

## 2022-10-10 MED ORDER — ALBUTEROL SULFATE HFA 108 (90 BASE) MCG/ACT IN AERS: 2.0000 | INHALATION_SPRAY | Freq: Four times a day (QID) | RESPIRATORY_TRACT | 1 refills | Status: DC | PRN

## 2022-10-10 MED ORDER — FLUTICASONE FUROATE-VILANTEROL 200-25 MCG/ACT IN AEPB: 1.0000 | INHALATION_SPRAY | Freq: Every day | RESPIRATORY_TRACT | 5 refills | Status: DC

## 2022-10-10 MED ORDER — CARBINOXAMINE MALEATE 4 MG/5ML PO SOLN: 4.0000 mg | Freq: Two times a day (BID) | ORAL | 5 refills | Status: DC | PRN

## 2022-10-10 NOTE — Patient Instructions (Signed)
Asthma Continue Breo 200-1 puff once a day to prevent cough or wheeze Continue montelukast 10 mg once a day to prevent cough or wheeze Continue albuterol 2 puffs every 4 hours as needed for cough or wheeze OR Instead use albuterol 0.083% solution via nebulizer one unit vial every 4 hours as needed for cough or wheeze You may use albuterol 5 to 15 minutes before activity to decrease cough or wheeze  Allergic rhinitis with flare Continue carbinoxamine 5 mL 2-3 times daily.  Continue montelukast once a day as listed above Continue flonase 2 sprays in each nostril daily Continue nasal saline gel as needed for dry nostrils Consider saline nasal rinses as needed for nasal symptoms. 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  In case of an allergic reaction, take Benadryl 50 mg every 4 hours, and if life-threatening symptoms occur, inject with AuviQ 0.3 mg. Consider skin testing to the hymenoptera panel  Penicillin allergy Consider penicillin skin testing followed by oral challenge.  Epistaxis Pinch both nostrils while leaning forward for at least 5 minutes before checking to see if the bleeding has stopped. If bleeding is not controlled within 5-10 minutes apply a cotton ball soaked with oxymetazoline (Afrin) to the bleeding nostril for a few seconds.  If the problem persists or worsens a referral to ENT for further evaluation may be necessary.  Obstructive sleep apnea Continue to follow-up with your pulmonary specialist team  Eustachian tube dysfunction Continue nasal saline rinses and flonase.    Call the clinic if this treatment plan is not working well for you.  Follow up in 6 months or sooner if needed.   Control of Dust Mite Allergen Dust mites play a major role in allergic asthma and rhinitis. They occur in environments with high humidity wherever human skin is found. Dust mites absorb humidity from the  atmosphere (ie, they do not drink) and feed on organic matter (including shed human and animal skin). Dust mites are a microscopic type of insect that you cannot see with the naked eye. High levels of dust mites have been detected from mattresses, pillows, carpets, upholstered furniture, bed covers, clothes, soft toys and any woven material. The principal allergen of the dust mite is found in its feces. A gram of dust may contain 1,000 mites and 250,000 fecal particles. Mite antigen is easily measured in the air during house cleaning activities. Dust mites do not bite and do not cause harm to humans, other than by triggering allergies/asthma.  Ways to decrease your exposure to dust mites in your home:  1. Encase mattresses, box springs and pillows with a mite-impermeable barrier or cover  2. Wash sheets, blankets and drapes weekly in hot water (130 F) with detergent and dry them in a dryer on the hot setting.  3. Have the room cleaned frequently with a vacuum cleaner and a damp dust-mop. For carpeting or rugs, vacuuming with a vacuum cleaner equipped with a high-efficiency particulate air (HEPA) filter. The dust mite allergic individual should not be in a room which is being cleaned and should wait 1 hour after cleaning before going into the room.  4. Do not sleep on upholstered furniture (eg, couches).  5. If possible removing carpeting, upholstered furniture and drapery from the home is ideal. Horizontal blinds should be eliminated in the rooms where the person spends the most time (bedroom, study, television room). Washable vinyl, roller-type shades are optimal.  6.  Remove all non-washable stuffed toys from the bedroom. Wash stuffed toys weekly like sheets and blankets above.  7. Reduce indoor humidity to less than 50%. Inexpensive humidity monitors can be purchased at most hardware stores. Do not use a humidifier as can make the problem worse and are not recommended.

## 2022-10-10 NOTE — Progress Notes (Signed)
FOLLOW UP Date of Service/Encounter:  10/10/22   Subjective:  Brittany Watts (DOB: [birth year]) is a 37 y.o. 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  History obtained from: chart review and patient.  For Review, LV was on 07/04/22  with Dr.Farren Nelles seen for acute visit for increased congestion, upcoming trip planned . See below for summary of history and diagnostics.  Therapeutic plans/changes recommended: given 40 mg IM methylprednisolone and continued on carbinoxamine and xhance nasal spray. ----------------------------------------------------- Pertinent History/Diagnostics:  Moderate persistent asthma without complication Past history - Patient was diagnosed with asthma over 10 years ago. Triggers are rain, allergies and pet exposure.  2022-2023- 3 courses of prednisone due to asthma exacerbations.  2020 -2022 spirometry was normal with no improvement in FEV1 post. bronchodilator treatment.   Previous treatments include Breo Allergic Rhinitis: SPT in 2011 was positive to grass, weed, trees, dust mites, cat, dog.  SPT 2013 testing was positive to cat, grass, dust mite, trees, ragweed, weed, cockroach, mold, horse, feather. Patient was on allergy injection for a few years but stopped due to localized reactions.  2020 bloodwork only positive to dust mites.  Isaiah Serge was attempted but she stopped due to oral pruritus.  She reported a pruritic rash after third dose as well.  Drug challenge on 08/05/2019 was passed and she was restarted on Odactra.  She did not tolerate the oral pruritus and stopped it again.  For medical therapy she has tried Product/process development scientist, Careers adviser, Zyrtec, Claritin, and Xyzal, xhance, Pataday,  Lumify.  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  Patient reports strict avoidance of mollusks including scallops, cinnamon, nutmeg.  She eats crustaceans without symptoms. 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  She has since avoided penicillin derivatives.  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  2020 hymenoptera panel was negative.  She reports no field stings --------------------------------------------------- Today presents for follow-up. She is no longer on [employer].  Her carbinoxamine is not being covered, and this has been the most helpful for her so far.  She is back to using Xyzal and Singulair which did not seem to be helping with her symptoms. She has a tickle and scratch in her throat.  She is worried that this will get out of control she does not find the correct antihistamine regimen. It appears that liquid carbinoxamine would be covered by her insurance, and she is okay trying liquid medications. Her asthma is currently controlled.  She is taking her Breo and montelukast as prescribed.  She is using her albuterol infrequently.  She did have a flare of her asthma few weeks ago at her office due to new paint applied to the walls. Her ears continue to pop and bother her as usual.  Nothing we have tried has seemed to help this problem.  Allergies as of 10/10/2022       Reactions   Cinnamon Anaphylaxis   Nutmeg Oil (myristica Oil) Anaphylaxis   Shellfish Allergy Itching, Nausea Only  Eyes watering, rhinorrhea. Report from [facility]   Doxycycline Nausea And Vomiting   Penicillins Hives, Itching   Fever, chills   Prednisone Itching        Medication List         Accurate as of October 10, 2022  5:26 PM. If you have any questions, ask your nurse or doctor.          STOP taking these medications    Carbinoxamine Maleate 4 MG Tabs Replaced by: Carbinoxamine Maleate 4 MG/5ML Soln Stopped by: [name]   ipratropium 0.03 % nasal spray Commonly known as: ATROVENT Stopped by: [name]   levocetirizine 5 MG tablet Commonly known as: XYZAL Stopped by: [name]   mometasone 50 MCG/ACT nasal spray Commonly known as: NASONEX Stopped by: Sunnie Nielsen 93 MCG/ACT Exhu Generic drug: Fluticasone Propionate Stopped by: [name]       TAKE these medications    albuterol (2.5 MG/3ML) 0.083% nebulizer solution Commonly known as: PROVENTIL Use 3 mL vial via nebulizer every 4 to 6 hours as needed for shortness of breath, cough or wheeze.   albuterol 108 (90 Base) MCG/ACT inhaler Commonly known as: VENTOLIN HFA Inhale 2 puffs into the lungs every 6 (six) hours as needed. Shortness of breath and wheezing   Carbinoxamine Maleate 4 MG/5ML Soln Take 5 mLs (4 mg total) by mouth 2 (two) times daily as needed. Replaces: Carbinoxamine Maleate 4 MG Tabs Started by: [name]   EPINEPHrine 0.3 mg/0.3 mL Soaj injection Commonly known as: Auvi-Q Inject 0.3 mg into the muscle as needed for anaphylaxis.   fluticasone furoate-vilanterol 200-25 MCG/ACT Aepb Commonly known as: Breo Ellipta Inhale 1 puff into the lungs daily.   meclizine 25 MG tablet Commonly known as: ANTIVERT Take 25 mg by mouth 3 (three) times daily as needed.   medroxyPROGESTERone 150 MG/ML injection Commonly known as: DEPO-PROVERA Depo-Provera 150 mg/mL intramuscular suspension  Inject 1 mL every 3 months by intramuscular route.   montelukast 10 MG tablet Commonly known as: Singulair Take 1 tablet (10 mg total) by mouth at bedtime.   MULTIVITAMIN PO Take by mouth.   ondansetron 8 MG disintegrating tablet Commonly known as:  ZOFRAN-ODT Take 8 mg by mouth 3 (three) times daily as needed.   scopolamine 1 MG/3DAYS Commonly known as: TRANSDERM-SCOP Transderm-Scop 1.5 mg transdermal patch (1 mg over 3 days)  UNWRAP AND APPLY 1 PATCH TO SKIN EVERY THIRD DAY   tolterodine 4 MG 24 hr capsule Commonly known as: DETROL LA Take 4 mg by mouth daily.       Past Medical History:  Diagnosis Date   Asthma    Seasonal allergies    Past Surgical History:  Procedure Laterality Date   ECTOPIC PREGNANCY SURGERY     Otherwise, there have been no changes to her past medical history, surgical history, family history, or social history.  ROS: All others negative except as noted per HPI.   Objective:  BP (!) 88/54   Pulse 88   Temp 98.4 F (36.9 C) (Temporal)   Resp 16   SpO2 100%  There is no height or weight on file to calculate BMI. Physical Exam: General Appearance:  Alert, cooperative, no distress, appears stated age  Head:  Normocephalic, without obvious abnormality, atraumatic  Eyes:  Conjunctiva clear, EOM's intact  Nose: Nares normal, hypertrophic turbinates, normal mucosa, and no visible anterior polyps  Throat: Lips, tongue normal;  teeth and gums normal, normal posterior oropharynx  Neck: Supple, symmetrical  Lungs:   clear to auscultation bilaterally, Respirations unlabored, no coughing  Heart:  regular rate and rhythm and no murmur, Appears well perfused  Extremities: No edema  Skin: Skin color, texture, turgor normal and no rashes or lesions on visualized portions of skin  Neurologic: No gross deficits   Assessment/Plan   Asthma-at goal Continue Breo 200-1 puff once a day to prevent cough or wheeze Continue montelukast 10 mg once a day to prevent cough or wheeze Continue albuterol 2 puffs every 4 hours as needed for cough or wheeze OR Instead use albuterol 0.083% solution via nebulizer one unit vial every 4 hours as needed for cough or wheeze You may use albuterol 5 to 15 minutes before  activity to decrease cough or wheeze  Allergic rhinitis with flare Continue carbinoxamine 5 mL 2-3 times daily.  Continue montelukast once a day as listed above Continue flonase 2 sprays in each nostril daily Continue nasal saline gel as needed for dry nostrils Consider saline nasal rinses as needed for nasal symptoms. Use this before any medicated nasal sprays for best result  Stinging insect allergy-stable Continue to avoid stinging insects.  In case of an allergic reaction, take Benadryl 50 mg every 4 hours, and if life-threatening symptoms occur, inject with AuviQ 0.3 mg. Consider skin testing to the hymenoptera panel  Penicillin allergy-stable Consider penicillin skin testing followed by oral challenge.  Epistaxis-controlled Pinch both nostrils while leaning forward for at least 5 minutes before checking to see if the bleeding has stopped. If bleeding is not controlled within 5-10 minutes apply a cotton ball soaked with oxymetazoline (Afrin) to the bleeding nostril for a few seconds.  If the problem persists or worsens a referral to ENT for further evaluation may be necessary.  Obstructive sleep apnea- Continue to follow-up with your pulmonary specialist team  Eustachian tube dysfunction-not at goal Continue nasal saline rinses and flonase.    Call the clinic if this treatment plan is not working well for you.  Follow up in 6 months or sooner if needed.  Tonny Bollman, MD  [facility]

## 2023-05-04 ENCOUNTER — Other Ambulatory Visit: Payer: Self-pay

## 2023-05-05 ENCOUNTER — Other Ambulatory Visit: Payer: Self-pay

## 2023-05-11 ENCOUNTER — Other Ambulatory Visit: Payer: Self-pay

## 2023-05-12 ENCOUNTER — Other Ambulatory Visit: Payer: Self-pay

## 2023-05-12 MED ORDER — LEVOCETIRIZINE DIHYDROCHLORIDE 5 MG PO TABS: 5.0000 mg | ORAL_TABLET | Freq: Every evening | ORAL | 5 refills | Status: DC

## 2023-08-24 ENCOUNTER — Other Ambulatory Visit: Payer: Self-pay

## 2023-08-24 ENCOUNTER — Encounter: Payer: Self-pay | Admitting: Family

## 2023-08-24 ENCOUNTER — Ambulatory Visit (INDEPENDENT_AMBULATORY_CARE_PROVIDER_SITE_OTHER): Admitting: Family

## 2023-08-24 VITALS — BP 108/76 | HR 113 | Temp 98.0°F | Resp 20 | Ht 64.0 in | Wt 158.3 lb

## 2023-08-24 DIAGNOSIS — Z91030 Bee allergy status: Secondary | ICD-10-CM | POA: Diagnosis not present

## 2023-08-24 DIAGNOSIS — J4540 Moderate persistent asthma, uncomplicated: Secondary | ICD-10-CM | POA: Diagnosis not present

## 2023-08-24 DIAGNOSIS — H6993 Unspecified Eustachian tube disorder, bilateral: Secondary | ICD-10-CM

## 2023-08-24 DIAGNOSIS — J3089 Other allergic rhinitis: Secondary | ICD-10-CM | POA: Diagnosis not present

## 2023-08-24 DIAGNOSIS — Z880 Allergy status to penicillin: Secondary | ICD-10-CM | POA: Diagnosis not present

## 2023-08-24 MED ORDER — MONTELUKAST SODIUM 10 MG PO TABS: 10.0000 mg | ORAL_TABLET | Freq: Every day | ORAL | 1 refills | Status: DC

## 2023-08-24 MED ORDER — EPINEPHRINE 0.3 MG/0.3ML IJ SOAJ: 0.3000 mg | INTRAMUSCULAR | 1 refills | Status: AC | PRN

## 2023-08-24 MED ORDER — METHYLPREDNISOLONE ACETATE 40 MG/ML IJ SUSP
40.0000 mg | Freq: Once | INTRAMUSCULAR | Status: AC
  Administered 2023-08-24: 40 mg via INTRAMUSCULAR

## 2023-08-24 MED ORDER — FLUTICASONE FUROATE-VILANTEROL 200-25 MCG/ACT IN AEPB: 1.0000 | INHALATION_SPRAY | Freq: Every day | RESPIRATORY_TRACT | 5 refills | Status: DC

## 2023-08-24 MED ORDER — ALBUTEROL SULFATE HFA 108 (90 BASE) MCG/ACT IN AERS: INHALATION_SPRAY | RESPIRATORY_TRACT | 1 refills | Status: AC

## 2023-08-24 MED ORDER — ALBUTEROL SULFATE (2.5 MG/3ML) 0.083% IN NEBU: INHALATION_SOLUTION | RESPIRATORY_TRACT | 1 refills | Status: AC

## 2023-08-24 NOTE — Patient Instructions (Addendum)
 Asthma-not well controlled Start Breo 200-1 puff once a day to prevent cough or wheeze Continue montelukast  10 mg once a day to prevent cough or wheeze Continue albuterol  2 puffs every 4 hours as needed for cough or wheeze OR Instead use albuterol  0.083% solution via nebulizer one unit vial every 4 hours as needed for cough or wheeze You may use albuterol  5 to 15 minutes before activity to decrease cough or wheeze  Asthma control goals:  Full participation in all desired activities (may need albuterol  before activity) Albuterol  use two time or less a week on average (not counting use with activity) Cough interfering with sleep two time or less a month Oral steroids no more than once a year No hospitalizations   Allergic rhinitis with current flare Continue to alternate between levo cetirizine and carbinoxamine  5 mL 2-3 times daily.  Continue montelukast  once a day as listed above Stop flonase  Start sample of Xhance  2 sprays in each nostril once a day as needed for stuffy nose.2 samples given. Stop if you start to have nose bleeds Continue nasal saline gel as needed for dry nostrils Consider saline nasal rinses as needed for nasal symptoms. Use this before any medicated nasal sprays for best result Consider updating your skin testing to environmental allergens Depo Medrol  40 mg given IM today in clinic  Stinging insect allergy Continue to avoid stinging insects.  In case of an allergic reaction, take Benadryl 50 mg every 4 hours, and if life-threatening symptoms occur, inject with AuviQ 0.3 mg. Consider skin testing to the hymenoptera panel  Penicillin allergy Consider penicillin skin testing followed by oral challenge.  Epistaxis Pinch both nostrils while leaning forward for at least 5 minutes before checking to see if the bleeding has stopped. If bleeding is not controlled within 5-10 minutes apply a cotton ball soaked with oxymetazoline (Afrin) to the bleeding nostril for a few  seconds.  If the problem persists or worsens a referral to ENT for further evaluation may be necessary.  Obstructive sleep apnea Continue to follow-up with your pulmonary specialist team  Eustachian tube dysfunction Continue nasal saline rinses and flonase.    Call the clinic if this treatment plan is not working well for you.  Follow up in 3 months or sooner if needed.   Control of Dust Mite Allergen Dust mites play a major role in allergic asthma and rhinitis. They occur in environments with high humidity wherever human skin is found. Dust mites absorb humidity from the atmosphere (ie, they do not drink) and feed on organic matter (including shed human and animal skin). Dust mites are a microscopic type of insect that you cannot see with the naked eye. High levels of dust mites have been detected from mattresses, pillows, carpets, upholstered furniture, bed covers, clothes, soft toys and any woven material. The principal allergen of the dust mite is found in its feces. A gram of dust may contain 1,000 mites and 250,000 fecal particles. Mite antigen is easily measured in the air during house cleaning activities. Dust mites do not bite and do not cause harm to humans, other than by triggering allergies/asthma.  Ways to decrease your exposure to dust mites in your home:  1. Encase mattresses, box springs and pillows with a mite-impermeable barrier or cover  2. Wash sheets, blankets and drapes weekly in hot water (130 F) with detergent and dry them in a dryer on the hot setting.  3. Have the room cleaned frequently with a vacuum cleaner  and a damp dust-mop. For carpeting or rugs, vacuuming with a vacuum cleaner equipped with a high-efficiency particulate air (HEPA) filter. The dust mite allergic individual should not be in a room which is being cleaned and should wait 1 hour after cleaning before going into the room.  4. Do not sleep on upholstered furniture (eg, couches).  5. If possible  removing carpeting, upholstered furniture and drapery from the home is ideal. Horizontal blinds should be eliminated in the rooms where the person spends the most time (bedroom, study, television room). Washable vinyl, roller-type shades are optimal.  6. Remove all non-washable stuffed toys from the bedroom. Wash stuffed toys weekly like sheets and blankets above.  7. Reduce indoor humidity to less than 50%. Inexpensive humidity monitors can be purchased at most hardware stores. Do not use a humidifier as can make the problem worse and are not recommended.

## 2023-08-24 NOTE — Progress Notes (Signed)
 [address] [place] [address] Dept: [phone]  FOLLOW UP NOTE  Patient ID: Brittany Watts, female    DOB: [birth year]  Age: 38 y.o. MRN: [id number] Date of Office Visit: 08/24/2023  Assessment  Chief Complaint: Follow-up (Sneezing, moved to small office exposed to dust and fumigated for bugs using nebulizer more than usual.  Denies cough)  HPI Brittany Watts is a 38 year old female who presents today for an acute visit.  She was last seen on October 10, 2022 by Dr. Cornel Diesel for asthma, allergic rhinitis with flare, stinging insect allergy, penicillin allergy, epistaxis, obstructive sleep apnea, and eustachian tube dysfunction.  She reports that she recently had surgery in her forehead region.  They thought the area was a cyst, but the plastic surgeon told her that it ended up being a blood vessel.  She feels like this occurred from blowing her nose so much.  She reports that last week around Tuesday her coworkers were wiping dust down and on Thursday she started sneezing like crazy.  Also on Thursday the bug people came and sprayed.  She was sneezing nonstop.  On Friday she got her hair done and they washed the pollen out of her hair.  During that time her nose was a [place] bit stuffy.  On Saturday she went to work and was sneezing and her nose was okay but a [place] bit stuffy.  She used her  humidifier and put  on some Vicks .  She also used her albuterol  via nebulizer because her chest felt tight.  She reports that this helped the tightness in her chest and it also broke up what was in her nose.  She had a clear thick plug come out from her nose.  On Sunday she laid in bed and her teeth hurt and she kept biting her jaw.  She cannot take Sudafed so she took Benadryl and ibuprofen.  She denies any fevers or chills.  She is a [place] bit dizzy, but also mentions that she does have a history of vertigo.  Asthma: She has not been using Breo 200 mcg 1 puff daily because she has been doing so  good.  This Saturday she had some tightness.  She also reports some coughing due to a tickle in her throat.  She also has some rib pain due to sneezing so often.  She denies fever, chills,wheezing, shortness of breath, and nocturnal awakenings due to breathing problems.  Since her last office visit she has not required any systemic steroids or made any trips to the emergency room or urgent care due to breathing problems.  She reports rare use of albuterol .  She does take montelukast  10 mg daily.  Allergic rhinitis: She alternates between levocetirizine and carbinoxamine .  She does take montelukast  daily and is not using Flonase nasal spray because it does not work.  She reports itchy throat, sneezing, itchy ears, clear rhinorrhea, sinus pressure, and nasal congestion.  She denies postnasal drip.  She has not been treated for any sinus infections since we last saw her.  She reports Xhance  is the only nasal spray that works, but it is hard to get.  She mentions that today she is not able to taste or smell.  Stinging insect allergy: She reports that she has not had any insect stings since her last office visit and has not had to use her epinephrine  autoinjector device.  She does mention that she is allergic to cinnamon and nutmeg and recently  her boyfriend was cooking chicken that had cinnamon on it.  Epistaxis: She has not had any nosebleeds since her last office visit.  Obstructive sleep apnea: She reports that she has not seen pulmonology for this.  She mentions that this was her boyfriend's idea.  She sleeps perfectly fine and does not have any issues.   Drug Allergies:  Allergies  Allergen Reactions   Cinnamon Anaphylaxis   Nutmeg Oil (Myristica Oil) Anaphylaxis   Shellfish Allergy Itching and Nausea Only    Eyes watering, rhinorrhea. Report from [facility]   Doxycycline Nausea And Vomiting   Penicillins Hives and Itching    Fever, chills   Prednisone Itching    Review of  Systems: Negative except as per HPI   Physical Exam: BP 108/76   Pulse (!) 113   Temp 98 F (36.7 C) (Temporal)   Resp 20   Ht 5\' 4"  (1.626 m)   Wt 158 lb 4.8 oz (71.8 kg)   SpO2 96%   BMI 27.17 kg/m    Physical Exam Constitutional:      Appearance: Normal appearance.  HENT:     Head: Normocephalic and atraumatic.     Comments: Pharynx normal, eyes normal, ears normal, nose: Bilateral lower turbinates moderately edematous and pale with clear drainage noted    Right Ear: Tympanic membrane, ear canal and external ear normal.     Left Ear: Tympanic membrane, ear canal and external ear normal.     Mouth/Throat:     Mouth: Mucous membranes are moist.     Pharynx: Oropharynx is clear.  Eyes:     Conjunctiva/sclera: Conjunctivae normal.  Cardiovascular:     Rate and Rhythm: Regular rhythm.     Heart sounds: Normal heart sounds.  Pulmonary:     Effort: Pulmonary effort is normal.     Breath sounds: Normal breath sounds.     Comments: Lungs clear to auscultation Musculoskeletal:     Cervical back: Neck supple.  Skin:    General: Skin is warm.  Neurological:     Mental Status: She is alert and oriented to person, place, and time.  Psychiatric:        Mood and Affect: Mood normal.        Behavior: Behavior normal.        Thought Content: Thought content normal.        Judgment: Judgment normal.     Diagnostics: FVC 2.60 L (82%), FEV1 2.02 L (77%), FEV1/FVC 0.78.  Predicted FVC 3.17 L, predicted FEV1 2.62 L.  Spirometry indicates normal respiratory function.  Assessment and Plan: 1. Perennial allergic rhinitis   2. Moderate persistent asthma without complication   3. History of penicillin allergy   4. Bee sting allergy   5. Dysfunction of both eustachian tubes     Meds ordered this encounter  Medications   fluticasone  furoate-vilanterol (BREO ELLIPTA ) 200-25 MCG/ACT AEPB    Sig: Inhale 1 puff into the lungs daily.    Dispense:  1 each    Refill:  5   EPINEPHrine   (AUVI-Q ) 0.3 mg/0.3 mL IJ SOAJ injection    Sig: Inject 0.3 mg into the muscle as needed for anaphylaxis.    Dispense:  2 each    Refill:  1    [phone]   montelukast  (SINGULAIR ) 10 MG tablet    Sig: Take 1 tablet (10 mg total) by mouth at bedtime.    Dispense:  90 tablet    Refill:  1  Give 90 day supplies   albuterol  (PROVENTIL ) (2.5 MG/3ML) 0.083% nebulizer solution    Sig: Use 3 mL vial via nebulizer every 4 to 6 hours as needed for shortness of breath, cough or wheeze.    Dispense:  75 mL    Refill:  1   albuterol  (VENTOLIN  HFA) 108 (90 Base) MCG/ACT inhaler    Sig: Inhale 2 puffs every 4-6 hours as needed for cough, wheeze, tightness in chest, shortness of breath    Dispense:  1 each    Refill:  1   methylPREDNISolone  acetate (DEPO-MEDROL ) injection 40 mg    Patient Instructions  Asthma-not well controlled Start Breo 200-1 puff once a day to prevent cough or wheeze Continue montelukast  10 mg once a day to prevent cough or wheeze Continue albuterol  2 puffs every 4 hours as needed for cough or wheeze OR Instead use albuterol  0.083% solution via nebulizer one unit vial every 4 hours as needed for cough or wheeze You may use albuterol  5 to 15 minutes before activity to decrease cough or wheeze  Asthma control goals:  Full participation in all desired activities (may need albuterol  before activity) Albuterol  use two time or less a week on average (not counting use with activity) Cough interfering with sleep two time or less a month Oral steroids no more than once a year No hospitalizations   Allergic rhinitis with current flare Continue to alternate between levo cetirizine and carbinoxamine  5 mL 2-3 times daily.  Continue montelukast  once a day as listed above Stop flonase  Start sample of Xhance  2 sprays in each nostril once a day as needed for stuffy nose.2 samples given. Stop if you start to have nose bleeds Continue nasal saline gel as needed for dry  nostrils Consider saline nasal rinses as needed for nasal symptoms. Use this before any medicated nasal sprays for best result Consider updating your skin testing to environmental allergens Depo Medrol  40 mg given IM today in clinic  Stinging insect allergy Continue to avoid stinging insects.  In case of an allergic reaction, take Benadryl 50 mg every 4 hours, and if life-threatening symptoms occur, inject with AuviQ 0.3 mg. Consider skin testing to the hymenoptera panel  Penicillin allergy Consider penicillin skin testing followed by oral challenge.  Epistaxis Pinch both nostrils while leaning forward for at least 5 minutes before checking to see if the bleeding has stopped. If bleeding is not controlled within 5-10 minutes apply a cotton ball soaked with oxymetazoline (Afrin) to the bleeding nostril for a few seconds.  If the problem persists or worsens a referral to ENT for further evaluation may be necessary.  Obstructive sleep apnea Continue to follow-up with your pulmonary specialist team  Eustachian tube dysfunction Continue nasal saline rinses and flonase.    Call the clinic if this treatment plan is not working well for you.  Follow up in 3 months or sooner if needed.   Control of Dust Mite Allergen Dust mites play a major role in allergic asthma and rhinitis. They occur in environments with high humidity wherever human skin is found. Dust mites absorb humidity from the atmosphere (ie, they do not drink) and feed on organic matter (including shed human and animal skin). Dust mites are a microscopic type of insect that you cannot see with the naked eye. High levels of dust mites have been detected from mattresses, pillows, carpets, upholstered furniture, bed covers, clothes, soft toys and any woven material. The principal allergen of the dust mite  is found in its feces. A gram of dust may contain 1,000 mites and 250,000 fecal particles. Mite antigen is easily measured in the air  during house cleaning activities. Dust mites do not bite and do not cause harm to humans, other than by triggering allergies/asthma.  Ways to decrease your exposure to dust mites in your home:  1. Encase mattresses, box springs and pillows with a mite-impermeable barrier or cover  2. Wash sheets, blankets and drapes weekly in hot water (130 F) with detergent and dry them in a dryer on the hot setting.  3. Have the room cleaned frequently with a vacuum cleaner and a damp dust-mop. For carpeting or rugs, vacuuming with a vacuum cleaner equipped with a high-efficiency particulate air (HEPA) filter. The dust mite allergic individual should not be in a room which is being cleaned and should wait 1 hour after cleaning before going into the room.  4. Do not sleep on upholstered furniture (eg, couches).  5. If possible removing carpeting, upholstered furniture and drapery from the home is ideal. Horizontal blinds should be eliminated in the rooms where the person spends the most time (bedroom, study, television room). Washable vinyl, roller-type shades are optimal.  6. Remove all non-washable stuffed toys from the bedroom. Wash stuffed toys weekly like sheets and blankets above.  7. Reduce indoor humidity to less than 50%. Inexpensive humidity monitors can be purchased at most hardware stores. Do not use a humidifier as can make the problem worse and are not recommended. Return in about 3 months (around 11/24/2023).    Thank you for the opportunity to care for this patient.  Please do not hesitate to contact me with questions.  Tinnie Forehand, FNP [facility] 

## 2023-08-26 ENCOUNTER — Telehealth: Payer: Self-pay | Admitting: Family

## 2023-08-26 NOTE — Telephone Encounter (Signed)
 Pt request a call back about a letter for her job so that she can be approved for a humidifier at work.

## 2023-08-27 ENCOUNTER — Encounter: Payer: Self-pay | Admitting: Internal Medicine

## 2023-08-27 ENCOUNTER — Encounter: Payer: Self-pay | Admitting: *Deleted

## 2023-08-27 NOTE — Telephone Encounter (Signed)
 Please let Brittany Watts know that with her dust mite allergy a humidifier can actually make her symptoms worse. I do not recommend a humidifier

## 2023-08-27 NOTE — Telephone Encounter (Signed)
 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

## 2023-10-09 ENCOUNTER — Encounter: Payer: Self-pay | Admitting: Advanced Practice Midwife

## 2023-11-11 ENCOUNTER — Other Ambulatory Visit: Payer: Self-pay

## 2023-11-11 MED ORDER — MONTELUKAST SODIUM 10 MG PO TABS: 10.0000 mg | ORAL_TABLET | Freq: Every day | ORAL | 1 refills | Status: DC

## 2023-11-11 NOTE — Telephone Encounter (Signed)
 Received fax from [facility] for refill on montelukast . Sent refill as requested

## 2023-11-26 NOTE — Patient Instructions (Incomplete)
 Asthma- ContinueBreo 200-1 puff once a day to prevent cough or wheeze Continue montelukast  10 mg once a day to prevent cough or wheeze Continue albuterol  2 puffs every 4 hours as needed for cough or wheeze OR Instead use albuterol  0.083% solution via nebulizer one unit vial every 4 hours as needed for cough or wheeze You may use albuterol  5 to 15 minutes before activity to decrease cough or wheeze  Asthma control goals:  Full participation in all desired activities (may need albuterol  before activity) Albuterol  use two time or less a week on average (not counting use with activity) Cough interfering with sleep two time or less a month Oral steroids no more than once a year No hospitalizations   Allergic rhinitis with current flare Continue to alternate between levo cetirizine and carbinoxamine  5 mL 2-3 times daily.  Continue montelukast  once a day as listed above Stop flonase  Continue Flonase 1-2 sprays in each nostril once a day as needed for stuffy nose Continue nasal saline gel as needed for dry nostrils Consider saline nasal rinses as needed for nasal symptoms. Use this before any medicated nasal sprays for best result Consider updating your skin testing to environmental allergens   Stinging insect allergy Continue to avoid stinging insects.  In case of an allergic reaction, take Benadryl 50 mg every 4 hours, and if life-threatening symptoms occur, inject with AuviQ 0.3 mg. Consider skin testing to the hymenoptera panel  Penicillin allergy Consider penicillin skin testing followed by oral challenge.  Epistaxis Pinch both nostrils while leaning forward for at least 5 minutes before checking to see if the bleeding has stopped. If bleeding is not controlled within 5-10 minutes apply a cotton ball soaked with oxymetazoline (Afrin) to the bleeding nostril for a few seconds.  If the problem persists or worsens a referral to ENT for further evaluation may be necessary.  Obstructive  sleep apnea Continue to follow-up with your pulmonary specialist team  Eustachian tube dysfunction Continue nasal saline rinses and flonase.    Call the clinic if this treatment plan is not working well for you.  Follow up in  months or sooner if needed.   Control of Dust Mite Allergen Dust mites play a major role in allergic asthma and rhinitis. They occur in environments with high humidity wherever human skin is found. Dust mites absorb humidity from the atmosphere (ie, they do not drink) and feed on organic matter (including shed human and animal skin). Dust mites are a microscopic type of insect that you cannot see with the naked eye. High levels of dust mites have been detected from mattresses, pillows, carpets, upholstered furniture, bed covers, clothes, soft toys and any woven material. The principal allergen of the dust mite is found in its feces. A gram of dust may contain 1,000 mites and 250,000 fecal particles. Mite antigen is easily measured in the air during house cleaning activities. Dust mites do not bite and do not cause harm to humans, other than by triggering allergies/asthma.  Ways to decrease your exposure to dust mites in your home:  1. Encase mattresses, box springs and pillows with a mite-impermeable barrier or cover  2. Wash sheets, blankets and drapes weekly in hot water (130 F) with detergent and dry them in a dryer on the hot setting.  3. Have the room cleaned frequently with a vacuum cleaner and a damp dust-mop. For carpeting or rugs, vacuuming with a vacuum cleaner equipped with a high-efficiency particulate air (HEPA) filter. The dust  mite allergic individual should not be in a room which is being cleaned and should wait 1 hour after cleaning before going into the room.  4. Do not sleep on upholstered furniture (eg, couches).  5. If possible removing carpeting, upholstered furniture and drapery from the home is ideal. Horizontal blinds should be eliminated in  the rooms where the person spends the most time (bedroom, study, television room). Washable vinyl, roller-type shades are optimal.  6. Remove all non-washable stuffed toys from the bedroom. Wash stuffed toys weekly like sheets and blankets above.  7. Reduce indoor humidity to less than 50%. Inexpensive humidity monitors can be purchased at most hardware stores. Do not use a humidifier as can make the problem worse and are not recommended.

## 2023-11-27 ENCOUNTER — Ambulatory Visit: Admitting: Family

## 2023-11-27 ENCOUNTER — Encounter: Payer: Self-pay | Admitting: Family

## 2023-11-27 VITALS — BP 92/60 | HR 69 | Resp 18

## 2023-11-27 DIAGNOSIS — J3089 Other allergic rhinitis: Secondary | ICD-10-CM

## 2023-11-27 DIAGNOSIS — H1013 Acute atopic conjunctivitis, bilateral: Secondary | ICD-10-CM | POA: Diagnosis not present

## 2023-11-27 DIAGNOSIS — J4540 Moderate persistent asthma, uncomplicated: Secondary | ICD-10-CM | POA: Diagnosis not present

## 2023-11-27 DIAGNOSIS — H6993 Unspecified Eustachian tube disorder, bilateral: Secondary | ICD-10-CM | POA: Diagnosis not present

## 2023-11-27 DIAGNOSIS — Z880 Allergy status to penicillin: Secondary | ICD-10-CM

## 2023-11-27 DIAGNOSIS — Z91030 Bee allergy status: Secondary | ICD-10-CM

## 2023-11-27 MED ORDER — EPINEPHRINE 0.3 MG/0.3ML IJ SOAJ: 0.3000 mg | INTRAMUSCULAR | 1 refills | Status: AC | PRN

## 2023-11-27 MED ORDER — FLUTICASONE FUROATE-VILANTEROL 200-25 MCG/ACT IN AEPB: 1.0000 | INHALATION_SPRAY | Freq: Every day | RESPIRATORY_TRACT | 5 refills | Status: DC

## 2023-11-27 MED ORDER — CARBINOXAMINE MALEATE 4 MG/5ML PO SOLN: 4.0000 mg | Freq: Two times a day (BID) | ORAL | 5 refills | Status: AC | PRN

## 2023-11-27 MED ORDER — MONTELUKAST SODIUM 10 MG PO TABS: 10.0000 mg | ORAL_TABLET | Freq: Every day | ORAL | 1 refills | Status: DC

## 2023-11-27 MED ORDER — XHANCE 93 MCG/ACT NA EXHU: INHALANT_SUSPENSION | NASAL | 5 refills | Status: AC

## 2023-11-27 NOTE — Progress Notes (Signed)
 [address] [place] [address] Dept: [phone]  FOLLOW UP NOTE  Patient ID: Brittany Watts, female    DOB: Nov 17, 1985  Age: 38 y.o. MRN: [id number] Date of Office Visit: 11/27/2023  Assessment  Chief Complaint: Allergies (Flared/)  HPI Brittany Watts is a 38 year old female who presents today for follow-up of perennial allergic rhinitis, moderate persistent asthma without complication, history of penicillin allergy, bee sting allergy, and dysfunction of both eustachian tubes.  She was last seen on August 24, 2023 by myself.  She denies any new diagnosis or surgery since her last office visit.  Asthma: She continues to take Breo 200 mcg 1 puff once a day, Singulair  10 mg daily and albuterol  as needed.  She denies cough, wheeze, tightness in chest, shortness of breath, and nocturnal awakenings due to breathing problems.  Since her last office visit she has not required any systemic steroids or made any trips to the emergency room or urgent care due to breathing problems.  She has not used her albuterol  inhaler in a while.  Perennial allergic rhinitis: She reports when she goes to her grandparents house in [place]  she will have symptoms.  For the past 2 weeks she has had sneezing nonstop, clear rhinorrhea, drainage that is clear, nasal congestion at times, and ears crackling.  She has not been treated for any sinus infections since we last saw her.  She continues to take montelukast  10 mg daily and used up a sample of Xhance  that was given at her last office visit.  She does take Xyzal  also 5 mg at night.  She reports that she has previously tried Flonase nasal spray, Nasacort  nasal spray, Nasonex  nasal spray and they do not help.  She has also previously been on allergy injections approximately 5 to 6 years ago and reports that they did not help.  All they did was just make her arms get spots on it..  She also reports that she has tried sublingual immunotherapy and it was not  effective.  She also reports that she has seen ear nose and throat in the past was told that airborne allergy was the cause of her symptoms and that if they were to do surgery the swelling would come back. She is able to taste and smell.  Stinging insect allergy: She reports that she has not had any insect stings since her last office visit and has not had to use her EpiPen .  She reports that she keeps her EpiPen  in the refrigerator.  Discussed that the EpiPen  had certain temperatures needed to be kept at.  Epistaxis.  She has not had any nosebleeds since her last office visit.  Obstructive sleep apnea.  She feels that she does not have sleep apnea.   Drug Allergies:  Allergies  Allergen Reactions   Cinnamon Anaphylaxis   Nutmeg Oil (Myristica Oil) Anaphylaxis   Shellfish Allergy Itching and Nausea Only    Eyes watering, rhinorrhea. Report from [facility]   Doxycycline Nausea And Vomiting   Penicillins Hives and Itching    Fever, chills   Prednisone Itching    Review of Systems: Negative except as per HPI   Physical Exam: BP 92/60   Pulse 69   Resp 18   SpO2 100%    Physical Exam Constitutional:      Appearance: Normal appearance.  HENT:     Head: Normocephalic and atraumatic.     Comments: Pharynx  normal, eyes normal, ears  normal, nose: Bilateral lower turbinates mildly edematous with no drainage noted    Right Ear: Tympanic membrane, ear canal and external ear normal.     Left Ear: Tympanic membrane, ear canal and external ear normal.     Mouth/Throat:     Mouth: Mucous membranes are moist.     Pharynx: Oropharynx is clear.  Eyes:     Conjunctiva/sclera: Conjunctivae normal.  Cardiovascular:     Rate and Rhythm: Regular rhythm.     Heart sounds: Normal heart sounds.  Pulmonary:     Effort: Pulmonary effort is normal.     Breath sounds: Normal breath sounds.     Comments: Lungs clear to auscultation Musculoskeletal:     Cervical back: Neck supple.   Skin:    General: Skin is warm.  Neurological:     Mental Status: She is alert and oriented to person, place, and time.  Psychiatric:        Mood and Affect: Mood normal.        Behavior: Behavior normal.        Thought Content: Thought content normal.        Judgment: Judgment normal.     Diagnostics:  Will get spirometry at her next office visit  Assessment and Plan: 1. Moderate persistent asthma without complication   2. Perennial allergic rhinitis   3. Dysfunction of both eustachian tubes   4. Allergic conjunctivitis of both eyes   5. Bee sting allergy   6. History of penicillin allergy     Meds ordered this encounter  Medications   EPINEPHrine  (EPIPEN  2-PAK) 0.3 mg/0.3 mL IJ SOAJ injection    Sig: Inject 0.3 mg into the muscle as needed.    Dispense:  2 each    Refill:  1   Carbinoxamine  Maleate 4 MG/5ML SOLN    Sig: Take 5 mLs (4 mg total) by mouth 2 (two) times daily as needed.    Dispense:  450 mL    Refill:  5   fluticasone  furoate-vilanterol (BREO ELLIPTA ) 200-25 MCG/ACT AEPB    Sig: Inhale 1 puff into the lungs daily.    Dispense:  1 each    Refill:  5   montelukast  (SINGULAIR ) 10 MG tablet    Sig: Take 1 tablet (10 mg total) by mouth at bedtime.    Dispense:  90 tablet    Refill:  1    Give 90 day supplies   Fluticasone  Propionate (XHANCE ) 93 MCG/ACT EXHU    Sig: Place 2 sprays in each nostril once a day as needed for stuffy nose    Dispense:  16 mL    Refill:  5    Patient Instructions  Asthma- controlled ContinueBreo 200-1 puff once a day to prevent cough or wheeze Continue montelukast  10 mg once a day to prevent cough or wheeze Continue albuterol  2 puffs every 4 hours as needed for cough or wheeze OR Instead use albuterol  0.083% solution via nebulizer one unit vial every 4 hours as needed for cough or wheeze You may use albuterol  5 to 15 minutes before activity to decrease cough or wheeze  Asthma control goals:  Full participation in all  desired activities (may need albuterol  before activity) Albuterol  use two time or less a week on average (not counting use with activity) Cough interfering with sleep two time or less a month Oral steroids no more than once a year No hospitalizations   Allergic rhinitis with current flare Continue to alternate between levocetirizine  and carbinoxamine  5 mL 2 times daily.  Continue montelukast  once a day as listed above Stop flonase  Start  Xhance  2 sprays in each nostril once a day as needed for stuffy nose. Sample given. We will see if we can get this covered Continue nasal saline gel as needed for dry nostrils Consider saline nasal rinses as needed for nasal symptoms. Use this before any medicated nasal sprays for best result Consider updating your skin testing to environmental allergens   Stinging insect allergy Continue to avoid stinging insects.  In case of an allergic reaction, take Benadryl 50 mg every 4 hours, and if life-threatening symptoms occur, inject with EpiPen  0.3 mg.Make sure to not put your EpiPen  in the refrigerator Consider skin testing to the hymenoptera panel  Penicillin allergy Consider penicillin skin testing followed by oral challenge.  Epistaxis-stable Pinch both nostrils while leaning forward for at least 5 minutes before checking to see if the bleeding has stopped. If bleeding is not controlled within 5-10 minutes apply a cotton ball soaked with oxymetazoline (Afrin) to the bleeding nostril for a few seconds.  If the problem persists or worsens a referral to ENT for further evaluation may be necessary.  Obstructive sleep apnea Continue to follow-up with your pulmonary specialist team  Eustachian tube dysfunction Continue nasal saline rinses and flonase.    Call the clinic if this treatment plan is not working well for you.  Recommend scheduling an appointment with your eye doctor to discuss your dry itchy eyes. Recommend not using eye drops that say  anti-eye redness Follow up in 4-6 months or sooner if needed.   Control of Dust Mite Allergen Dust mites play a major role in allergic asthma and rhinitis. They occur in environments with high humidity wherever human skin is found. Dust mites absorb humidity from the atmosphere (ie, they do not drink) and feed on organic matter (including shed human and animal skin). Dust mites are a microscopic type of insect that you cannot see with the naked eye. High levels of dust mites have been detected from mattresses, pillows, carpets, upholstered furniture, bed covers, clothes, soft toys and any woven material. The principal allergen of the dust mite is found in its feces. A gram of dust may contain 1,000 mites and 250,000 fecal particles. Mite antigen is easily measured in the air during house cleaning activities. Dust mites do not bite and do not cause harm to humans, other than by triggering allergies/asthma.  Ways to decrease your exposure to dust mites in your home:  1. Encase mattresses, box springs and pillows with a mite-impermeable barrier or cover  2. Wash sheets, blankets and drapes weekly in hot water (130 F) with detergent and dry them in a dryer on the hot setting.  3. Have the room cleaned frequently with a vacuum cleaner and a damp dust-mop. For carpeting or rugs, vacuuming with a vacuum cleaner equipped with a high-efficiency particulate air (HEPA) filter. The dust mite allergic individual should not be in a room which is being cleaned and should wait 1 hour after cleaning before going into the room.  4. Do not sleep on upholstered furniture (eg, couches).  5. If possible removing carpeting, upholstered furniture and drapery from the home is ideal. Horizontal blinds should be eliminated in the rooms where the person spends the most time (bedroom, study, television room). Washable vinyl, roller-type shades are optimal.  6. Remove all non-washable stuffed toys from the bedroom. Wash  stuffed toys weekly like sheets  and blankets above.  7. Reduce indoor humidity to less than 50%. Inexpensive humidity monitors can be purchased at most hardware stores. Do not use a humidifier as can make the problem worse and are not recommended.  Return in about 6 months (around 05/26/2024), or if symptoms worsen or fail to improve.    Thank you for the opportunity to care for this patient.  Please do not hesitate to contact me with questions.  Wanda Craze, FNP [facility] 

## 2023-12-17 ENCOUNTER — Telehealth: Payer: Self-pay

## 2023-12-17 ENCOUNTER — Other Ambulatory Visit (HOSPITAL_COMMUNITY): Payer: Self-pay

## 2023-12-17 NOTE — Telephone Encounter (Signed)
*  AA  Pharmacy Patient Advocate Encounter   Received notification from Fax that prior authorization for Xhance  Nasal Spray is required/requested.   Insurance verification completed.   The patient is insured through [facility] .   Per test claim: PA required; PA submitted to above mentioned insurance via Latent Key/confirmation #/EOC [id number] Status is pending

## 2023-12-17 NOTE — Telephone Encounter (Signed)
 Your request has been approved Approved. Authorization Expiration09/25/2026

## 2024-01-07 ENCOUNTER — Telehealth: Payer: Self-pay | Admitting: Internal Medicine

## 2024-01-07 NOTE — Telephone Encounter (Signed)
 Pharmacy called and stated they need Carbinoxamine  changed to the extended form to fill it.

## 2024-03-08 ENCOUNTER — Other Ambulatory Visit: Payer: Self-pay | Admitting: Internal Medicine

## 2024-03-10 ENCOUNTER — Other Ambulatory Visit: Payer: Self-pay | Admitting: *Deleted

## 2024-03-10 MED ORDER — MONTELUKAST SODIUM 10 MG PO TABS: 10.0000 mg | ORAL_TABLET | Freq: Every day | ORAL | 0 refills | Status: DC

## 2024-03-18 ENCOUNTER — Other Ambulatory Visit: Payer: Self-pay

## 2024-03-18 ENCOUNTER — Emergency Department (HOSPITAL_BASED_OUTPATIENT_CLINIC_OR_DEPARTMENT_OTHER)
Admission: EM | Admit: 2024-03-18 | Discharge: 2024-03-18 | Disposition: A | Discharge status: Home / Self Care | Attending: Emergency Medicine | Admitting: Emergency Medicine

## 2024-03-18 ENCOUNTER — Encounter (HOSPITAL_BASED_OUTPATIENT_CLINIC_OR_DEPARTMENT_OTHER): Payer: Self-pay | Admitting: Emergency Medicine

## 2024-03-18 DIAGNOSIS — R059 Cough, unspecified: Secondary | ICD-10-CM | POA: Diagnosis present

## 2024-03-18 DIAGNOSIS — J45909 Unspecified asthma, uncomplicated: Secondary | ICD-10-CM | POA: Diagnosis not present

## 2024-03-18 DIAGNOSIS — R0981 Nasal congestion: Secondary | ICD-10-CM | POA: Diagnosis not present

## 2024-03-18 DIAGNOSIS — J111 Influenza due to unidentified influenza virus with other respiratory manifestations: Secondary | ICD-10-CM

## 2024-03-18 MED ORDER — ACETAMINOPHEN 325 MG PO TABS
650.0000 mg | ORAL_TABLET | Freq: Once | ORAL | Status: AC
  Administered 2024-03-18: 650 mg via ORAL
  Filled 2024-03-18: qty 2

## 2024-03-18 MED ORDER — OSELTAMIVIR PHOSPHATE 75 MG PO CAPS: 75.0000 mg | ORAL_CAPSULE | Freq: Two times a day (BID) | ORAL | 0 refills | Status: AC

## 2024-03-18 NOTE — ED Provider Notes (Signed)
 "  [facility] [place]  Provider Note  CSN: [id number] Arrival date & time: 03/18/24 0133  History Chief Complaint  Patient presents with   flu-like symptoms    Brittany Watts is a 38 y.o. female with history of asthma reports 24 hours of cough congestion, fever after being exposed to family with influenza earlier this week.    Home Medications Prior to Admission medications  Medication Sig Start Date End Date Taking? Authorizing Provider  oseltamivir  (TAMIFLU ) 75 MG capsule Take 1 capsule (75 mg total) by mouth every 12 (twelve) hours. 03/18/24  Yes Roselyn Carlin NOVAK, MD  albuterol  (PROVENTIL ) (2.5 MG/3ML) 0.083% nebulizer solution Use 3 mL vial via nebulizer every 4 to 6 hours as needed for shortness of breath, cough or wheeze. 08/24/23   Cheryl Reusing, FNP  albuterol  (VENTOLIN  HFA) 108 [phone] Base) MCG/ACT inhaler Inhale 2 puffs every 4-6 hours as needed for cough, wheeze, tightness in chest, shortness of breath 08/24/23   Cheryl Reusing, FNP  Carbinoxamine  Maleate 4 MG/5ML SOLN Take 5 mLs (4 mg total) by mouth 2 (two) times daily as needed. 11/27/23   Cheryl Reusing, FNP  EPINEPHrine  (AUVI-Q ) 0.3 mg/0.3 mL IJ SOAJ injection Inject 0.3 mg into the muscle as needed for anaphylaxis. 08/24/23   Cheryl Reusing, FNP  EPINEPHrine  (EPIPEN  2-PAK) 0.3 mg/0.3 mL IJ SOAJ injection Inject 0.3 mg into the muscle as needed. 11/27/23   Cheryl Reusing, FNP  fluticasone  furoate-vilanterol (BREO ELLIPTA ) 200-25 MCG/ACT AEPB Inhale 1 puff into the lungs daily. 11/27/23   Cheryl Reusing, FNP  Fluticasone  Propionate (XHANCE ) 93 MCG/ACT EXHU Place 2 sprays in each nostril once a day as needed for stuffy nose 11/27/23   Cheryl Reusing, FNP  levocetirizine (XYZAL ) 5 MG tablet TAKE ONE TABLET BY MOUTH EVERY EVENING 03/08/24   Marinda Rocky SAILOR, MD  meclizine (ANTIVERT) 25 MG tablet Take 25 mg by mouth 3 (three) times daily as needed. 06/15/20   [provider]   medroxyPROGESTERone (DEPO-PROVERA) 150 MG/ML injection Depo-Provera 150 mg/mL intramuscular suspension  Inject 1 mL every 3 months by intramuscular route.    [provider]  montelukast  (SINGULAIR ) 10 MG tablet Take 1 tablet (10 mg total) by mouth at bedtime. 03/10/24   Cheryl Reusing, FNP  Multiple Vitamin (MULTIVITAMIN PO) Take by mouth.    [provider]  ondansetron (ZOFRAN-ODT) 8 MG disintegrating tablet Take 8 mg by mouth 3 (three) times daily as needed. 02/26/19   [provider]  scopolamine (TRANSDERM-SCOP) 1 MG/3DAYS Transderm-Scop 1.5 mg transdermal patch (1 mg over 3 days)  UNWRAP AND APPLY 1 PATCH TO SKIN EVERY THIRD DAY    [provider]  tolterodine (DETROL LA) 4 MG 24 hr capsule Take 4 mg by mouth daily. 12/21/18   [provider]     Allergies    Cinnamon, Nutmeg oil (myristica oil), Shellfish allergy, Doxycycline, Penicillins, and Prednisone   Review of Systems   Review of Systems Please see HPI for pertinent positives and negatives  Physical Exam BP (!) 142/79 (BP Location: Right Arm)   Pulse (!) 117   Temp (!) 101.9 F (38.8 C) (Oral)   Resp 20   Ht 5' 4 (1.626 m)   Wt 68.9 kg   SpO2 98%   BMI 26.09 kg/m   Physical Exam Vitals and nursing note reviewed.  Constitutional:      Appearance: Normal appearance.  HENT:     Head: Normocephalic and atraumatic.  Right Ear: Tympanic membrane normal.     Left Ear: Tympanic membrane normal.     Nose: Nose normal.     Mouth/Throat:     Mouth: Mucous membranes are moist.  Eyes:     Extraocular Movements: Extraocular movements intact.     Conjunctiva/sclera: Conjunctivae normal.  Cardiovascular:     Rate and Rhythm: Tachycardia present.  Pulmonary:     Effort: Pulmonary effort is normal.     Breath sounds: Normal breath sounds. No wheezing, rhonchi or rales.  Abdominal:     General: Abdomen is flat.     Palpations: Abdomen is soft.     Tenderness: There is  no abdominal tenderness.  Musculoskeletal:        General: No swelling. Normal range of motion.     Cervical back: Neck supple.  Skin:    General: Skin is warm and dry.  Neurological:     General: No focal deficit present.     Mental Status: She is alert.  Psychiatric:        Mood and Affect: Mood normal.     ED Results / Procedures / Treatments   EKG None  Procedures Procedures  Medications Ordered in the ED Medications  acetaminophen  (TYLENOL ) tablet 650 mg (has no administration in time range)    Initial Impression and Plan  Patient here with flu-like illness. She is febrile and mildly tachycardic but normal exam otherwise and no concern for bacterial infection or sepsis. Given her short duration of symptoms and chronic illness offered Tamiflu  empirically. She does not want to wait for Covid/Flu/RSV swab results but will take Tamiflu  Rx. Recommend supportive and symptomatic care, antipyretics as needed, oral hydration, rest and isolation. PCP follow up, RTED for any other concerns.    ED Course       MDM Rules/Calculators/A&P Medical Decision Making Problems Addressed: Influenza-like illness: acute illness or injury  Risk OTC drugs. Prescription drug management.     Final Clinical Impression(s) / ED Diagnoses Final diagnoses:  Influenza-like illness    Rx / DC Orders ED Discharge Orders          Ordered    oseltamivir  (TAMIFLU ) 75 MG capsule  Every 12 hours        03/18/24 0149             Roselyn Carlin NOVAK, MD 03/18/24 0150  "

## 2024-03-18 NOTE — ED Triage Notes (Signed)
 L ear pain, cough, sneezing, and runny nose since last night. No flu shot. Tachycardic.

## 2024-03-31 NOTE — Patient Instructions (Addendum)
 Asthma- controlled ContinueBreo 200-1 puff once a day to prevent cough or wheeze. Rinse mouth out after Continue montelukast  10 mg once a day to prevent cough or wheeze Continue albuterol  2 puffs every 4 hours as needed for cough or wheeze OR Instead use albuterol  0.083% solution via nebulizer one unit vial every 4 hours as needed for cough or wheeze You may use albuterol  5 to 15 minutes before activity to decrease cough or wheeze  Asthma control goals:  Full participation in all desired activities (may need albuterol  before activity) Albuterol  use two time or less a week on average (not counting use with activity) Cough interfering with sleep two time or less a month Oral steroids no more than once a year No hospitalizations   Allergic rhinitis-stable  Continue to alternate between levocetirizine and carbinoxamine  5 mL 2 times daily.  Continue montelukast  once a day as listed above Continue Xhance  2 sprays in each nostril once a day as needed for stuffy nose. Sample given. We will see if we can get this covered Continue nasal saline gel as needed for dry nostrils Consider saline nasal rinses as needed for nasal symptoms. Use this before any medicated nasal sprays for best result Consider updating your skin testing to environmental allergens   Stinging insect allergy Continue to avoid stinging insects.  In case of an allergic reaction, take Benadryl 50 mg every 4 hours, and if life-threatening symptoms occur, inject with EpiPen  0.3 mg.Make sure to not put your EpiPen  in the refrigerator Consider skin testing to the hymenoptera panel  Penicillin allergy Consider penicillin skin testing followed by oral challenge.  Epistaxis-stable Pinch both nostrils while leaning forward for at least 5 minutes before checking to see if the bleeding has stopped. If bleeding is not controlled within 5-10 minutes apply a cotton ball soaked with oxymetazoline (Afrin) to the bleeding nostril for a few  seconds.  If the problem persists or worsens a referral to ENT for further evaluation may be necessary.  Obstructive sleep apnea Continue to follow-up with your pulmonary specialist team  Eustachian tube dysfunction Continue nasal saline rinses and Xhance .    Call the clinic if this treatment plan is not working well for you.  Follow up in 6  months or sooner if needed.   Control of Dust Mite Allergen Dust mites play a major role in allergic asthma and rhinitis. They occur in environments with high humidity wherever human skin is found. Dust mites absorb humidity from the atmosphere (ie, they do not drink) and feed on organic matter (including shed human and animal skin). Dust mites are a microscopic type of insect that you cannot see with the naked eye. High levels of dust mites have been detected from mattresses, pillows, carpets, upholstered furniture, bed covers, clothes, soft toys and any woven material. The principal allergen of the dust mite is found in its feces. A gram of dust may contain 1,000 mites and 250,000 fecal particles. Mite antigen is easily measured in the air during house cleaning activities. Dust mites do not bite and do not cause harm to humans, other than by triggering allergies/asthma.  Ways to decrease your exposure to dust mites in your home:  1. Encase mattresses, box springs and pillows with a mite-impermeable barrier or cover  2. Wash sheets, blankets and drapes weekly in hot water (130 F) with detergent and dry them in a dryer on the hot setting.  3. Have the room cleaned frequently with a vacuum cleaner and a damp  dust-mop. For carpeting or rugs, vacuuming with a vacuum cleaner equipped with a high-efficiency particulate air (HEPA) filter. The dust mite allergic individual should not be in a room which is being cleaned and should wait 1 hour after cleaning before going into the room.  4. Do not sleep on upholstered furniture (eg, couches).  5. If possible  removing carpeting, upholstered furniture and drapery from the home is ideal. Horizontal blinds should be eliminated in the rooms where the person spends the most time (bedroom, study, television room). Washable vinyl, roller-type shades are optimal.  6. Remove all non-washable stuffed toys from the bedroom. Wash stuffed toys weekly like sheets and blankets above.  7. Reduce indoor humidity to less than 50%. Inexpensive humidity monitors can be purchased at most hardware stores. Do not use a humidifier as can make the problem worse and are not recommended.

## 2024-04-01 ENCOUNTER — Other Ambulatory Visit: Payer: Self-pay

## 2024-04-01 ENCOUNTER — Encounter: Payer: Self-pay | Admitting: Family

## 2024-04-01 ENCOUNTER — Ambulatory Visit: Admitting: Family

## 2024-04-01 ENCOUNTER — Telehealth: Payer: Self-pay

## 2024-04-01 VITALS — BP 114/70 | HR 88 | Temp 98.1°F | Resp 20 | Wt 152.5 lb

## 2024-04-01 DIAGNOSIS — J4540 Moderate persistent asthma, uncomplicated: Secondary | ICD-10-CM

## 2024-04-01 DIAGNOSIS — Z91030 Bee allergy status: Secondary | ICD-10-CM

## 2024-04-01 DIAGNOSIS — J3089 Other allergic rhinitis: Secondary | ICD-10-CM

## 2024-04-01 DIAGNOSIS — H6993 Unspecified Eustachian tube disorder, bilateral: Secondary | ICD-10-CM | POA: Diagnosis not present

## 2024-04-01 DIAGNOSIS — Z880 Allergy status to penicillin: Secondary | ICD-10-CM | POA: Diagnosis not present

## 2024-04-01 MED ORDER — MONTELUKAST SODIUM 10 MG PO TABS: 10.0000 mg | ORAL_TABLET | Freq: Every day | ORAL | 5 refills | Status: AC

## 2024-04-01 MED ORDER — FLUTICASONE FUROATE-VILANTEROL 200-25 MCG/ACT IN AEPB: INHALATION_SPRAY | RESPIRATORY_TRACT | 5 refills | Status: AC

## 2024-04-01 NOTE — Telephone Encounter (Signed)
 If she wants we can try sending the prescription to another pharmacy. Please let me know

## 2024-04-01 NOTE — Telephone Encounter (Signed)
 Called [facility] [phone] per Camilla  they can not get any carbinoxamine  there is no supplier available

## 2024-04-01 NOTE — Progress Notes (Signed)
 "  [address] [place] [address] Dept: [phone]  FOLLOW UP NOTE  Patient ID: Brittany Watts, female    DOB: [birth year]  Age: 39 y.o. MRN: [id number] Date of Office Visit: 04/01/2024  Assessment  Chief Complaint: Follow-up (Doing well, could not get carbonaxamine at [facility]. She had the flu over christmas)  HPI Brittany Watts is a 39 year old female who presents today for follow-up of moderate persistent asthma without complication, perennial allergic rhinitis, dysfunction of both eustachian tubes, allergic conjunctivitis, bee sting allergy, and history of penicillin allergy.  Since her last office visit she reports that she was diagnosed with influenza after Christmas.  She has not had any surgery since her last office visit.  Asthma: She reports that she had some coughing with influenza, but has otherwise been good.  She denies now any cough, wheeze, tightness in chest, shortness of breath, and nocturnal awakenings due to breathing problems.  Since her last office visit she has not received any systemic steroids due to her asthma.  She does report that she went to urgent care over Christmas due to influenza and was given Tamiflu .  She reports that she asked for steroids, but was not given any.  She reports that she takes Breo 200 mcg 1 puff once a day, montelukast  10 mg daily, and albuterol  as needed.  She uses her albuterol  unless needed.  Allergic rhinitis: She reports that she always has rhinorrhea and nasal congestion.  She denies postnasal drip.  Her rhinorrhea is clear in color.  She has not been treated for any sinus infections since we last saw her.  She continues to alternate between levocetirizine and carbinoxamine .  She takes montelukast  daily and uses Xhance  nasal spray as needed.  She has previously tried allergy injections and sublingual immunotherapy.  She also has seen ENT in the past.  She has not had any nosebleeds since we last saw her.  Insect allergy: She  reports that she has not had any insect stings since her last office use her EpiPen .  Obstructive sleep apnea: She reports that she not have a CPAP or BiPAP machine.  It was her ex who had an issue and it does not bother her.  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  Eustachian tube dysfunction: She uses Xhance  nasal spray as needed.  She reports itching in her ears at times with dry flaky skin.   Drug Allergies:  Allergies[1]  Review of Systems: Negative except as per HPI   Physical Exam: BP 114/70   Pulse 88   Temp 98.1 F (36.7 C) (Temporal)   Resp 20   Wt 152 lb 8 oz (69.2 kg)   SpO2 100%   BMI 26.18 kg/m    Physical Exam Constitutional:      Appearance: Normal appearance.  HENT:     Head: Normocephalic and atraumatic.     Comments: Pharynx normal. Eyes normal. Ears normal. Nose normal    Right Ear: Tympanic membrane, ear canal and external ear normal.     Left Ear: Tympanic membrane, ear canal and external ear normal.     Nose: Nose normal.     Mouth/Throat:     Mouth: Mucous membranes are moist.     Pharynx: Oropharynx is clear.  Eyes:     Conjunctiva/sclera: Conjunctivae normal.  Cardiovascular:     Rate and Rhythm: Regular rhythm.     Heart sounds: Normal heart sounds.  Pulmonary:     Effort: Pulmonary effort is normal.  Breath sounds: Normal breath sounds.     Comments: Lungs clear to auscultation Musculoskeletal:     Cervical back: Neck supple.  Skin:    General: Skin is warm.  Neurological:     Mental Status: She is alert and oriented to person, place, and time.  Psychiatric:        Mood and Affect: Mood normal.        Behavior: Behavior normal.        Thought Content: Thought content normal.        Judgment: Judgment normal.     Diagnostics:  None. Will get spirometry at next office visit  Assessment and Plan: 1. Moderate persistent asthma without complication   2. Perennial allergic rhinitis   3. Dysfunction of both eustachian tubes   4. Bee  sting allergy   5. History of penicillin allergy     Meds ordered this encounter  Medications   montelukast  (SINGULAIR ) 10 MG tablet    Sig: Take 1 tablet (10 mg total) by mouth at bedtime.    Dispense:  30 tablet    Refill:  5   fluticasone  furoate-vilanterol (BREO ELLIPTA ) 200-25 MCG/ACT AEPB    Sig: Inhale 1 puff once a day.  Rinse mouth out afterwards    Dispense:  1 each    Refill:  5    Patient Instructions  Asthma- controlled ContinueBreo 200-1 puff once a day to prevent cough or wheeze. Rinse mouth out after Continue montelukast  10 mg once a day to prevent cough or wheeze Continue albuterol  2 puffs every 4 hours as needed for cough or wheeze OR Instead use albuterol  0.083% solution via nebulizer one unit vial every 4 hours as needed for cough or wheeze You may use albuterol  5 to 15 minutes before activity to decrease cough or wheeze  Asthma control goals:  Full participation in all desired activities (may need albuterol  before activity) Albuterol  use two time or less a week on average (not counting use with activity) Cough interfering with sleep two time or less a month Oral steroids no more than once a year No hospitalizations   Allergic rhinitis-stable  Continue to alternate between levocetirizine and carbinoxamine  5 mL 2 times daily.  Continue montelukast  once a day as listed above Continue Xhance  2 sprays in each nostril once a day as needed for stuffy nose. Sample given. We will see if we can get this covered Continue nasal saline gel as needed for dry nostrils Consider saline nasal rinses as needed for nasal symptoms. Use this before any medicated nasal sprays for best result Consider updating your skin testing to environmental allergens   Stinging insect allergy Continue to avoid stinging insects.  In case of an allergic reaction, take Benadryl 50 mg every 4 hours, and if life-threatening symptoms occur, inject with EpiPen  0.3 mg.Make sure to not put your  EpiPen  in the refrigerator Consider skin testing to the hymenoptera panel  Penicillin allergy Consider penicillin skin testing followed by oral challenge.  Epistaxis-stable Pinch both nostrils while leaning forward for at least 5 minutes before checking to see if the bleeding has stopped. If bleeding is not controlled within 5-10 minutes apply a cotton ball soaked with oxymetazoline (Afrin) to the bleeding nostril for a few seconds.  If the problem persists or worsens a referral to ENT for further evaluation may be necessary.  Obstructive sleep apnea Continue to follow-up with your pulmonary specialist team  Eustachian tube dysfunction Continue nasal saline rinses and Xhance .  Call the clinic if this treatment plan is not working well for you.  Follow up in 6  months or sooner if needed.   Control of Dust Mite Allergen Dust mites play a major role in allergic asthma and rhinitis. They occur in environments with high humidity wherever human skin is found. Dust mites absorb humidity from the atmosphere (ie, they do not drink) and feed on organic matter (including shed human and animal skin). Dust mites are a microscopic type of insect that you cannot see with the naked eye. High levels of dust mites have been detected from mattresses, pillows, carpets, upholstered furniture, bed covers, clothes, soft toys and any woven material. The principal allergen of the dust mite is found in its feces. A gram of dust may contain 1,000 mites and 250,000 fecal particles. Mite antigen is easily measured in the air during house cleaning activities. Dust mites do not bite and do not cause harm to humans, other than by triggering allergies/asthma.  Ways to decrease your exposure to dust mites in your home:  1. Encase mattresses, box springs and pillows with a mite-impermeable barrier or cover  2. Wash sheets, blankets and drapes weekly in hot water (130 F) with detergent and dry them in a dryer on the hot  setting.  3. Have the room cleaned frequently with a vacuum cleaner and a damp dust-mop. For carpeting or rugs, vacuuming with a vacuum cleaner equipped with a high-efficiency particulate air (HEPA) filter. The dust mite allergic individual should not be in a room which is being cleaned and should wait 1 hour after cleaning before going into the room.  4. Do not sleep on upholstered furniture (eg, couches).  5. If possible removing carpeting, upholstered furniture and drapery from the home is ideal. Horizontal blinds should be eliminated in the rooms where the person spends the most time (bedroom, study, television room). Washable vinyl, roller-type shades are optimal.  6. Remove all non-washable stuffed toys from the bedroom. Wash stuffed toys weekly like sheets and blankets above.  7. Reduce indoor humidity to less than 50%. Inexpensive humidity monitors can be purchased at most hardware stores. Do not use a humidifier as can make the problem worse and are not recommended.  Return in about 6 months (around 09/29/2024), or if symptoms worsen or fail to improve.    Thank you for the opportunity to care for this patient.  Please do not hesitate to contact me with questions.  Wanda Craze, FNP [facility]         [1]  Allergies Allergen Reactions   Cinnamon Anaphylaxis   Nutmeg Oil (Myristica Oil) Anaphylaxis   Shellfish Allergy Itching and Nausea Only    Eyes watering, rhinorrhea. Report from [facility]   Doxycycline Nausea And Vomiting   Penicillins Hives and Itching    Fever, chills   Prednisone Itching   "

## 2024-04-04 NOTE — Telephone Encounter (Signed)
 Patient states she used it as needed . She will call us  if symptoms get intolerable. She is ok this winter without the medication

## 2024-09-30 ENCOUNTER — Ambulatory Visit: Admitting: Family
